# Patient Record
Sex: Female | Born: 1990 | Race: White | Hispanic: No | Marital: Single | Smoking: Never smoker
Health system: Southern US, Community
[De-identification: ages and names within clinical notes are randomized; demographics above are authoritative.]

## PROBLEM LIST (undated history)

## (undated) ENCOUNTER — Emergency Department (HOSPITAL_COMMUNITY): Discharge status: Absent without leave | Payer: Self-pay

## (undated) DIAGNOSIS — G43909 Migraine, unspecified, not intractable, without status migrainosus: Secondary | ICD-10-CM

## (undated) HISTORY — DX: Migraine, unspecified, not intractable, without status migrainosus: G43.909

## (undated) HISTORY — PX: NO PAST SURGERIES: SHX2092

---
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
## 2012-02-26 ENCOUNTER — Telehealth: Payer: Self-pay

## 2012-02-26 NOTE — Telephone Encounter (Signed)
Spoke to pt mom advising her what med pt was on for UTI 09/20/11. Pt mom states med pt is on now is making her sick and wanted to advise new dr. Edmonia Lynch she has taken in pass for UTI and was better.     MW

## 2012-07-24 ENCOUNTER — Encounter: Payer: Self-pay | Admitting: Family Medicine

## 2012-07-24 ENCOUNTER — Ambulatory Visit (INDEPENDENT_AMBULATORY_CARE_PROVIDER_SITE_OTHER): Payer: BC Managed Care – PPO | Admitting: Family Medicine

## 2012-07-24 VITALS — BP 118/74 | HR 54 | Temp 98.3°F | Ht 63.5 in | Wt 145.6 lb

## 2012-07-24 DIAGNOSIS — Z3041 Encounter for surveillance of contraceptive pills: Secondary | ICD-10-CM

## 2012-07-24 DIAGNOSIS — R21 Rash and other nonspecific skin eruption: Secondary | ICD-10-CM

## 2012-07-24 DIAGNOSIS — Z0000 Encounter for general adult medical examination without abnormal findings: Secondary | ICD-10-CM

## 2012-07-24 MED ORDER — NORETHIN ACE-ETH ESTRAD-FE 1-20 MG-MCG PO TABS: 1.0000 | ORAL_TABLET | Freq: Every day | ORAL | Status: DC

## 2012-07-24 MED ORDER — MOMETASONE FUROATE 0.1 % EX CREA: TOPICAL_CREAM | Freq: Every day | CUTANEOUS | Status: DC

## 2012-07-24 NOTE — Patient Instructions (Signed)
Preventive Care for Adults, Female A healthy lifestyle and preventive care can promote health and wellness. Preventive health guidelines for women include the following key practices.  A routine yearly physical is a good way to check with your caregiver about your health and preventive screening. It is a chance to share any concerns and updates on your health, and to receive a thorough exam.  Visit your dentist for a routine exam and preventive care every 6 months. Brush your teeth twice a day and floss once a day. Good oral hygiene prevents tooth decay and gum disease.  The frequency of eye exams is based on your age, health, family medical history, use of contact lenses, and other factors. Follow your caregiver's recommendations for frequency of eye exams.  Eat a healthy diet. Foods like vegetables, fruits, whole grains, low-fat dairy products, and lean protein foods contain the nutrients you need without too many calories. Decrease your intake of foods high in solid fats, added sugars, and salt. Eat the right amount of calories for you.Get information about a proper diet from your caregiver, if necessary.  Regular physical exercise is one of the most important things you can do for your health. Most adults should get at least 150 minutes of moderate-intensity exercise (any activity that increases your heart rate and causes you to sweat) each week. In addition, most adults need muscle-strengthening exercises on 2 or more days a week.  Maintain a healthy weight. The body mass index (BMI) is a screening tool to identify possible weight problems. It provides an estimate of body fat based on height and weight. Your caregiver can help determine your BMI, and can help you achieve or maintain a healthy weight.For adults 20 years and older:  A BMI below 18.5 is considered underweight.  A BMI of 18.5 to 24.9 is normal.  A BMI of 25 to 29.9 is considered overweight.  A BMI of 30 and above is  considered obese.  Maintain normal blood lipids and cholesterol levels by exercising and minimizing your intake of saturated fat. Eat a balanced diet with plenty of fruit and vegetables. Blood tests for lipids and cholesterol should begin at age 20 and be repeated every 5 years. If your lipid or cholesterol levels are high, you are over 50, or you are at high risk for heart disease, you may need your cholesterol levels checked more frequently.Ongoing high lipid and cholesterol levels should be treated with medicines if diet and exercise are not effective.  If you smoke, find out from your caregiver how to quit. If you do not use tobacco, do not start.  If you are pregnant, do not drink alcohol. If you are breastfeeding, be very cautious about drinking alcohol. If you are not pregnant and choose to drink alcohol, do not exceed 1 drink per day. One drink is considered to be 12 ounces (355 mL) of beer, 5 ounces (148 mL) of wine, or 1.5 ounces (44 mL) of liquor.  Avoid use of street drugs. Do not share needles with anyone. Ask for help if you need support or instructions about stopping the use of drugs.  High blood pressure causes heart disease and increases the risk of stroke. Your blood pressure should be checked at least every 1 to 2 years. Ongoing high blood pressure should be treated with medicines if weight loss and exercise are not effective.  If you are 55 to 20 years old, ask your caregiver if you should take aspirin to prevent strokes.  Diabetes   screening involves taking a blood sample to check your fasting blood sugar level. This should be done once every 3 years, after age 45, if you are within normal weight and without risk factors for diabetes. Testing should be considered at a younger age or be carried out more frequently if you are overweight and have at least 1 risk factor for diabetes.  Breast cancer screening is essential preventive care for women. You should practice "breast  self-awareness." This means understanding the normal appearance and feel of your breasts and may include breast self-examination. Any changes detected, no matter how small, should be reported to a caregiver. Women in their 20s and 30s should have a clinical breast exam (CBE) by a caregiver as part of a regular health exam every 1 to 3 years. After age 40, women should have a CBE every year. Starting at age 40, women should consider having a mammography (breast X-ray test) every year. Women who have a family history of breast cancer should talk to their caregiver about genetic screening. Women at a high risk of breast cancer should talk to their caregivers about having magnetic resonance imaging (MRI) and a mammography every year.  The Pap test is a screening test for cervical cancer. A Pap test can show cell changes on the cervix that might become cervical cancer if left untreated. A Pap test is a procedure in which cells are obtained and examined from the lower end of the uterus (cervix).  Women should have a Pap test starting at age 21.  Between ages 21 and 29, Pap tests should be repeated every 2 years.  Beginning at age 30, you should have a Pap test every 3 years as long as the past 3 Pap tests have been normal.  Some women have medical problems that increase the chance of getting cervical cancer. Talk to your caregiver about these problems. It is especially important to talk to your caregiver if a new problem develops soon after your last Pap test. In these cases, your caregiver may recommend more frequent screening and Pap tests.  The above recommendations are the same for women who have or have not gotten the vaccine for human papillomavirus (HPV).  If you had a hysterectomy for a problem that was not cancer or a condition that could lead to cancer, then you no longer need Pap tests. Even if you no longer need a Pap test, a regular exam is a good idea to make sure no other problems are  starting.  If you are between ages 65 and 70, and you have had normal Pap tests going back 10 years, you no longer need Pap tests. Even if you no longer need a Pap test, a regular exam is a good idea to make sure no other problems are starting.  If you have had past treatment for cervical cancer or a condition that could lead to cancer, you need Pap tests and screening for cancer for at least 20 years after your treatment.  If Pap tests have been discontinued, risk factors (such as a new sexual partner) need to be reassessed to determine if screening should be resumed.  The HPV test is an additional test that may be used for cervical cancer screening. The HPV test looks for the virus that can cause the cell changes on the cervix. The cells collected during the Pap test can be tested for HPV. The HPV test could be used to screen women aged 30 years and older, and should   be used in women of any age who have unclear Pap test results. After the age of 30, women should have HPV testing at the same frequency as a Pap test.  Colorectal cancer can be detected and often prevented. Most routine colorectal cancer screening begins at the age of 50 and continues through age 75. However, your caregiver may recommend screening at an earlier age if you have risk factors for colon cancer. On a yearly basis, your caregiver may provide home test kits to check for hidden blood in the stool. Use of a small camera at the end of a tube, to directly examine the colon (sigmoidoscopy or colonoscopy), can detect the earliest forms of colorectal cancer. Talk to your caregiver about this at age 50, when routine screening begins. Direct examination of the colon should be repeated every 5 to 10 years through age 75, unless early forms of pre-cancerous polyps or small growths are found.  Hepatitis C blood testing is recommended for all people born from 1945 through 1965 and any individual with known risks for hepatitis C.  Practice  safe sex. Use condoms and avoid high-risk sexual practices to reduce the spread of sexually transmitted infections (STIs). STIs include gonorrhea, chlamydia, syphilis, trichomonas, herpes, HPV, and human immunodeficiency virus (HIV). Herpes, HIV, and HPV are viral illnesses that have no cure. They can result in disability, cancer, and death. Sexually active women aged 25 and younger should be checked for chlamydia. Older women with new or multiple partners should also be tested for chlamydia. Testing for other STIs is recommended if you are sexually active and at increased risk.  Osteoporosis is a disease in which the bones lose minerals and strength with aging. This can result in serious bone fractures. The risk of osteoporosis can be identified using a bone density scan. Women ages 65 and over and women at risk for fractures or osteoporosis should discuss screening with their caregivers. Ask your caregiver whether you should take a calcium supplement or vitamin D to reduce the rate of osteoporosis.  Menopause can be associated with physical symptoms and risks. Hormone replacement therapy is available to decrease symptoms and risks. You should talk to your caregiver about whether hormone replacement therapy is right for you.  Use sunscreen with sun protection factor (SPF) of 30 or more. Apply sunscreen liberally and repeatedly throughout the day. You should seek shade when your shadow is shorter than you. Protect yourself by wearing long sleeves, pants, a wide-brimmed hat, and sunglasses year round, whenever you are outdoors.  Once a month, do a whole body skin exam, using a mirror to look at the skin on your back. Notify your caregiver of new moles, moles that have irregular borders, moles that are larger than a pencil eraser, or moles that have changed in shape or color.  Stay current with required immunizations.  Influenza. You need a dose every fall (or winter). The composition of the flu vaccine  changes each year, so being vaccinated once is not enough.  Pneumococcal polysaccharide. You need 1 to 2 doses if you smoke cigarettes or if you have certain chronic medical conditions. You need 1 dose at age 65 (or older) if you have never been vaccinated.  Tetanus, diphtheria, pertussis (Tdap, Td). Get 1 dose of Tdap vaccine if you are younger than age 65, are over 65 and have contact with an infant, are a healthcare worker, are pregnant, or simply want to be protected from whooping cough. After that, you need a Td   booster dose every 10 years. Consult your caregiver if you have not had at least 3 tetanus and diphtheria-containing shots sometime in your life or have a deep or dirty wound.  HPV. You need this vaccine if you are a woman age 26 or younger. The vaccine is given in 3 doses over 6 months.  Measles, mumps, rubella (MMR). You need at least 1 dose of MMR if you were born in 1957 or later. You may also need a second dose.  Meningococcal. If you are age 19 to 21 and a first-year college student living in a residence hall, or have one of several medical conditions, you need to get vaccinated against meningococcal disease. You may also need additional booster doses.  Zoster (shingles). If you are age 60 or older, you should get this vaccine.  Varicella (chickenpox). If you have never had chickenpox or you were vaccinated but received only 1 dose, talk to your caregiver to find out if you need this vaccine.  Hepatitis A. You need this vaccine if you have a specific risk factor for hepatitis A virus infection or you simply wish to be protected from this disease. The vaccine is usually given as 2 doses, 6 to 18 months apart.  Hepatitis B. You need this vaccine if you have a specific risk factor for hepatitis B virus infection or you simply wish to be protected from this disease. The vaccine is given in 3 doses, usually over 6 months. Preventive Services / Frequency Ages 19 to 39  Blood  pressure check.** / Every 1 to 2 years.  Lipid and cholesterol check.** / Every 5 years beginning at age 20.  Clinical breast exam.** / Every 3 years for women in their 20s and 30s.  Pap test.** / Every 2 years from ages 21 through 29. Every 3 years starting at age 30 through age 65 or 70 with a history of 3 consecutive normal Pap tests.  HPV screening.** / Every 3 years from ages 30 through ages 65 to 70 with a history of 3 consecutive normal Pap tests.  Hepatitis C blood test.** / For any individual with known risks for hepatitis C.  Skin self-exam. / Monthly.  Influenza immunization.** / Every year.  Pneumococcal polysaccharide immunization.** / 1 to 2 doses if you smoke cigarettes or if you have certain chronic medical conditions.  Tetanus, diphtheria, pertussis (Tdap, Td) immunization. / A one-time dose of Tdap vaccine. After that, you need a Td booster dose every 10 years.  HPV immunization. / 3 doses over 6 months, if you are 26 and younger.  Measles, mumps, rubella (MMR) immunization. / You need at least 1 dose of MMR if you were born in 1957 or later. You may also need a second dose.  Meningococcal immunization. / 1 dose if you are age 19 to 21 and a first-year college student living in a residence hall, or have one of several medical conditions, you need to get vaccinated against meningococcal disease. You may also need additional booster doses.  Varicella immunization.** / Consult your caregiver.  Hepatitis A immunization.** / Consult your caregiver. 2 doses, 6 to 18 months apart.  Hepatitis B immunization.** / Consult your caregiver. 3 doses usually over 6 months. Ages 40 to 64  Blood pressure check.** / Every 1 to 2 years.  Lipid and cholesterol check.** / Every 5 years beginning at age 20.  Clinical breast exam.** / Every year after age 40.  Mammogram.** / Every year beginning at age 40   and continuing for as long as you are in good health. Consult with your  caregiver.  Pap test.** / Every 3 years starting at age 30 through age 65 or 70 with a history of 3 consecutive normal Pap tests.  HPV screening.** / Every 3 years from ages 30 through ages 65 to 70 with a history of 3 consecutive normal Pap tests.  Fecal occult blood test (FOBT) of stool. / Every year beginning at age 50 and continuing until age 75. You may not need to do this test if you get a colonoscopy every 10 years.  Flexible sigmoidoscopy or colonoscopy.** / Every 5 years for a flexible sigmoidoscopy or every 10 years for a colonoscopy beginning at age 50 and continuing until age 75.  Hepatitis C blood test.** / For all people born from 1945 through 1965 and any individual with known risks for hepatitis C.  Skin self-exam. / Monthly.  Influenza immunization.** / Every year.  Pneumococcal polysaccharide immunization.** / 1 to 2 doses if you smoke cigarettes or if you have certain chronic medical conditions.  Tetanus, diphtheria, pertussis (Tdap, Td) immunization.** / A one-time dose of Tdap vaccine. After that, you need a Td booster dose every 10 years.  Measles, mumps, rubella (MMR) immunization. / You need at least 1 dose of MMR if you were born in 1957 or later. You may also need a second dose.  Varicella immunization.** / Consult your caregiver.  Meningococcal immunization.** / Consult your caregiver.  Hepatitis A immunization.** / Consult your caregiver. 2 doses, 6 to 18 months apart.  Hepatitis B immunization.** / Consult your caregiver. 3 doses, usually over 6 months. Ages 65 and over  Blood pressure check.** / Every 1 to 2 years.  Lipid and cholesterol check.** / Every 5 years beginning at age 20.  Clinical breast exam.** / Every year after age 40.  Mammogram.** / Every year beginning at age 40 and continuing for as long as you are in good health. Consult with your caregiver.  Pap test.** / Every 3 years starting at age 30 through age 65 or 70 with a 3  consecutive normal Pap tests. Testing can be stopped between 65 and 70 with 3 consecutive normal Pap tests and no abnormal Pap or HPV tests in the past 10 years.  HPV screening.** / Every 3 years from ages 30 through ages 65 or 70 with a history of 3 consecutive normal Pap tests. Testing can be stopped between 65 and 70 with 3 consecutive normal Pap tests and no abnormal Pap or HPV tests in the past 10 years.  Fecal occult blood test (FOBT) of stool. / Every year beginning at age 50 and continuing until age 75. You may not need to do this test if you get a colonoscopy every 10 years.  Flexible sigmoidoscopy or colonoscopy.** / Every 5 years for a flexible sigmoidoscopy or every 10 years for a colonoscopy beginning at age 50 and continuing until age 75.  Hepatitis C blood test.** / For all people born from 1945 through 1965 and any individual with known risks for hepatitis C.  Osteoporosis screening.** / A one-time screening for women ages 65 and over and women at risk for fractures or osteoporosis.  Skin self-exam. / Monthly.  Influenza immunization.** / Every year.  Pneumococcal polysaccharide immunization.** / 1 dose at age 65 (or older) if you have never been vaccinated.  Tetanus, diphtheria, pertussis (Tdap, Td) immunization. / A one-time dose of Tdap vaccine if you are over   65 and have contact with an infant, are a healthcare worker, or simply want to be protected from whooping cough. After that, you need a Td booster dose every 10 years.  Varicella immunization.** / Consult your caregiver.  Meningococcal immunization.** / Consult your caregiver.  Hepatitis A immunization.** / Consult your caregiver. 2 doses, 6 to 18 months apart.  Hepatitis B immunization.** / Check with your caregiver. 3 doses, usually over 6 months. ** Family history and personal history of risk and conditions may change your caregiver's recommendations. Document Released: 06/26/2001 Document Revised: 07/23/2011  Document Reviewed: 09/25/2010 [employer] Patient Information 2013 [employer].  

## 2012-07-24 NOTE — Progress Notes (Signed)
Subjective:     Jordan Dominguez is a 22 y.o. female and is here for a comprehensive physical exam. The patient reports problems - skin rash , insomnia,  headaches.  History   Social History  . Marital Status: Single    Spouse Name: N/A    Number of Children: N/A  . Years of Education: N/A   Occupational History  . Not on file.   Social History Main Topics  . Smoking status: Never Smoker   . Smokeless tobacco: Never Used  . Alcohol Use: Yes     Comment: rare  . Drug Use: No  . Sexually Active: Yes -- Female partner(s)    Birth Control/ Protection: Pill   Other Topics Concern  . Not on file   Social History Narrative  . No narrative on file   Health Maintenance  Topic Date Due  . Chlamydia Screening  05/03/2008  . Influenza Vaccine  01/12/2010  . Pap Smear  05/04/2011  . Tetanus/tdap  05/03/2012    The following portions of the patient's history were reviewed and updated as appropriate:  She  has no past medical history on file. She  does not have any pertinent problems on file. She  has no past surgical history on file. Her family history includes ADD / ADHD in her mother; Diabetes in her maternal grandmother; Hyperlipidemia in her maternal grandmother; and Hypertension in her maternal grandmother. She  reports that she has never smoked. She has never used smokeless tobacco. She reports that  drinks alcohol. She reports that she does not use illicit drugs. She has a current medication list which includes the following prescription(s): norethindrone-ethinyl estradiol, mometasone, and norethindrone-ethinyl estradiol. No current outpatient prescriptions on file prior to visit.   No current facility-administered medications on file prior to visit.   She has No Known Allergies..  Review of Systems Review of Systems  Constitutional: Negative for activity change, appetite change and fatigue.  HENT: Negative for hearing loss, congestion, tinnitus and ear discharge.   dentist q60m Eyes: Negative for visual disturbance  Respiratory: Negative for cough, chest tightness and shortness of breath.   Cardiovascular: Negative for chest pain, palpitations and leg swelling.  Gastrointestinal: Negative for abdominal pain, diarrhea, constipation and abdominal distention.  Genitourinary: Negative for urgency, frequency, decreased urine volume and difficulty urinating.  Musculoskeletal: Negative for back pain, arthralgias and gait problem.  Skin: Negative for color change, pallor and rash.  Neurological: Negative for dizziness, light-headedness, numbness and headaches.  Hematological: Negative for adenopathy. Does not bruise/bleed easily.  Psychiatric/Behavioral: Negative for suicidal ideas, confusion, sleep disturbance, self-injury, dysphoric mood, decreased concentration and agitation.       Objective:    BP 118/74  Pulse 54  Temp(Src) 98.3 F (36.8 C) (Oral)  Ht 5' 3.5" (1.613 m)  Wt 145 lb 9.6 oz (66.044 kg)  BMI 25.38 kg/m2  SpO2 98% General appearance: alert, cooperative, appears stated age and no distress Head: Normocephalic, without obvious abnormality, atraumatic Eyes: conjunctivae/corneas clear. PERRL, EOM's intact. Fundi benign. Ears: normal TM's and external ear canals both ears Nose: Nares normal. Septum midline. Mucosa normal. No drainage or sinus tenderness. Throat: lips, mucosa, and tongue normal; teeth and gums normal Neck: no adenopathy, supple, symmetrical, trachea midline and thyroid not enlarged, symmetric, no tenderness/mass/nodules Back: symmetric, no curvature. ROM normal. No CVA tenderness. Lungs: clear to auscultation bilaterally Breasts: normal appearance, no masses or tenderness Heart: regular rate and rhythm, S1, S2 normal, no murmur, click, rub or gallop Abdomen: soft,  non-tender; bowel sounds normal; no masses,  no organomegaly Extremities: extremities normal, atraumatic, no cyanosis or edema Pulses: 2+ and symmetric Skin:  Skin color, texture, turgor normal. No rashes or lesions Lymph nodes: Cervical, supraclavicular, and axillary nodes normal. Neurologic: Alert and oriented X 3, normal strength and tone. Normal symmetric reflexes. Normal coordination and gait    Assessment:    Healthy female exam.      Plan:    ghm utd Check cbc and urine See After Visit Summary for Counseling Recommendations

## 2012-07-25 LAB — CBC WITH DIFFERENTIAL/PLATELET
Basophils Relative: 1.6 % (ref 0.0–3.0)
Eosinophils Absolute: 0.1 10*3/uL (ref 0.0–0.7)
Eosinophils Relative: 1.3 % (ref 0.0–5.0)
HCT: 37 % (ref 36.0–46.0)
Lymphs Abs: 3.1 10*3/uL (ref 0.7–4.0)
MCHC: 33.3 g/dL (ref 30.0–36.0)
MCV: 84.4 fl (ref 78.0–100.0)
Monocytes Absolute: 0.3 10*3/uL (ref 0.1–1.0)
Platelets: 329 10*3/uL (ref 150.0–400.0)
WBC: 8.5 10*3/uL (ref 4.5–10.5)

## 2012-07-25 LAB — GC/CHLAMYDIA PROBE AMP, URINE: Chlamydia, Swab/Urine, PCR: NEGATIVE

## 2012-12-04 ENCOUNTER — Encounter: Payer: Self-pay | Admitting: Internal Medicine

## 2012-12-04 ENCOUNTER — Ambulatory Visit (INDEPENDENT_AMBULATORY_CARE_PROVIDER_SITE_OTHER): Payer: BC Managed Care – PPO | Admitting: Internal Medicine

## 2012-12-04 VITALS — BP 104/68 | HR 72 | Temp 98.3°F | Wt 139.0 lb

## 2012-12-04 DIAGNOSIS — J029 Acute pharyngitis, unspecified: Secondary | ICD-10-CM

## 2012-12-04 MED ORDER — CEPHALEXIN 500 MG PO CAPS: 500.0000 mg | ORAL_CAPSULE | Freq: Two times a day (BID) | ORAL | Status: DC

## 2012-12-04 NOTE — Progress Notes (Signed)
  Subjective:    Patient ID: Jordan Dominguez, female    DOB: Jun 16, 1990, 22 y.o.   MRN: [id number]  HPI   Symptoms began 12/03/12 as a sore throat associated with swelling in the back of her throat and bilateral submandibular pain. She's also had some headache over the crown.  Claritin and Benadryl were of minimal benefit    Review of Systems  She denies any significant extrinsic symptoms of itchy, watery eyes, or sneezing. She's had no fever, chills, or sweats.  She also denies frontal sinus pain, facial pain, nasal purulence, dental pain, otic pain, or otic discharge  She has no cough, sputum, shortness of breath, or wheezing.     Objective:   Physical Exam  General appearance:good health ;well nourished; no acute distress or increased work of breathing is present but appearsfatigued.  No  lymphadenopathy about the head, neck, or axilla noted.   Eyes: No conjunctival inflammation or lid edema is present.   Ears:  External ear exam shows no significant lesions or deformities.  Otoscopic examination reveals clear canals, tympanic membranes are intact bilaterally without bulging, retraction, inflammation or discharge.  Nose:  External nasal examination shows no deformity or inflammation. Nasal mucosa are pink and moist without lesions or exudates. No septal dislocation or deviation.No obstruction to airflow.   Oral exam: Dental hygiene is good; lips and gums are healthy appearing.There is marked R oropharyngeal erythema with slight exudate  & 1.5 + tonsilar hypertrophy   Neck:  No deformities,  masses, or tenderness noted.   Supple with full range of motion without pain.   Heart:  Normal rate and regular rhythm. S1 and S2 normal without gallop, murmur, click, rub or other extra sounds.   Lungs:Chest clear to auscultation; no wheezes, rhonchi,rales ,or rubs present.No increased work of breathing.    Extremities:  No cyanosis, edema, or clubbing  noted    Skin: Warm & dry           Assessment & Plan:  #1 pharyngitis with tonsillar hypertrophy and slight exudate; rule out strep/rule out mono  Plan see orders  & recommendations

## 2012-12-04 NOTE — Patient Instructions (Addendum)
Zicam Melts or Zinc lozenges as per package label for scratchy throat . Complementary options include  vitamin C 2000 mg daily; & Echinacea for 4-7 days. Report persistent or progressive fever; discolored nasal or chest secretions; or frontal headache or facial  pain.      

## 2013-08-06 ENCOUNTER — Telehealth: Payer: Self-pay | Admitting: Family Medicine

## 2013-08-06 DIAGNOSIS — IMO0001 Reserved for inherently not codable concepts without codable children: Secondary | ICD-10-CM

## 2013-08-06 MED ORDER — NORETHINDRONE ACET-ETHINYL EST 1-20 MG-MCG PO TABS: 1.0000 | ORAL_TABLET | Freq: Every day | ORAL | Status: DC

## 2013-08-06 NOTE — Telephone Encounter (Signed)
Refill for 1 month of BCP sent to

## 2013-08-06 NOTE — Telephone Encounter (Signed)
Patient is requesting a [facility] of her [facility]

## 2013-08-12 ENCOUNTER — Other Ambulatory Visit: Payer: Self-pay | Admitting: Family Medicine

## 2013-08-12 MED ORDER — NORETHINDRONE ACET-ETHINYL EST 1-20 MG-MCG PO TABS: 1.0000 | ORAL_TABLET | Freq: Every day | ORAL | Status: DC

## 2013-08-12 NOTE — Telephone Encounter (Signed)
Rx re-sent   KP 

## 2013-08-12 NOTE — Telephone Encounter (Signed)
Patient called and stated that her pharmacy still does not have her BCP. Please resend thanks.

## 2013-08-12 NOTE — Addendum Note (Signed)
Addended by: Arnette NorrisPAYNE, Adelia Baptista P on: 08/12/2013 11:03 AM   Modules accepted: Orders

## 2014-11-19 ENCOUNTER — Telehealth: Payer: Self-pay | Admitting: *Deleted

## 2014-11-19 NOTE — Telephone Encounter (Signed)
Unable to reach patient at time of Pre-Visit Call (she is in class)  Grandfather answered phone and stated she is aware of appointment Monday at 1:30.

## 2014-11-22 ENCOUNTER — Encounter: Payer: Self-pay | Admitting: Family Medicine

## 2014-11-22 ENCOUNTER — Other Ambulatory Visit (HOSPITAL_COMMUNITY)
Admission: RE | Admit: 2014-11-22 | Discharge: 2014-11-22 | Disposition: A | Discharge status: Home / Self Care | Payer: Commercial Managed Care - PPO | Source: Ambulatory Visit | Attending: Family Medicine | Admitting: Family Medicine

## 2014-11-22 ENCOUNTER — Ambulatory Visit (INDEPENDENT_AMBULATORY_CARE_PROVIDER_SITE_OTHER): Payer: Commercial Managed Care - PPO | Admitting: Family Medicine

## 2014-11-22 VITALS — BP 118/73 | HR 60 | Temp 98.3°F | Ht 63.75 in | Wt 171.8 lb

## 2014-11-22 DIAGNOSIS — Z3009 Encounter for other general counseling and advice on contraception: Secondary | ICD-10-CM

## 2014-11-22 DIAGNOSIS — G43109 Migraine with aura, not intractable, without status migrainosus: Secondary | ICD-10-CM | POA: Diagnosis not present

## 2014-11-22 DIAGNOSIS — Z124 Encounter for screening for malignant neoplasm of cervix: Secondary | ICD-10-CM

## 2014-11-22 DIAGNOSIS — Z113 Encounter for screening for infections with a predominantly sexual mode of transmission: Secondary | ICD-10-CM | POA: Insufficient documentation

## 2014-11-22 DIAGNOSIS — Z01419 Encounter for gynecological examination (general) (routine) without abnormal findings: Secondary | ICD-10-CM | POA: Diagnosis not present

## 2014-11-22 DIAGNOSIS — Z0000 Encounter for general adult medical examination without abnormal findings: Secondary | ICD-10-CM | POA: Diagnosis not present

## 2014-11-22 DIAGNOSIS — N760 Acute vaginitis: Secondary | ICD-10-CM | POA: Diagnosis present

## 2014-11-22 DIAGNOSIS — G43809 Other migraine, not intractable, without status migrainosus: Secondary | ICD-10-CM

## 2014-11-22 DIAGNOSIS — Z418 Encounter for other procedures for purposes other than remedying health state: Secondary | ICD-10-CM

## 2014-11-22 DIAGNOSIS — R8290 Unspecified abnormal findings in urine: Secondary | ICD-10-CM

## 2014-11-22 DIAGNOSIS — Z299 Encounter for prophylactic measures, unspecified: Secondary | ICD-10-CM

## 2014-11-22 MED ORDER — ELETRIPTAN HYDROBROMIDE 20 MG PO TABS: 20.0000 mg | ORAL_TABLET | ORAL | Status: DC | PRN

## 2014-11-22 MED ORDER — NORETHIN ACE-ETH ESTRAD-FE 1-20 MG-MCG PO TABS: 1.0000 | ORAL_TABLET | Freq: Every day | ORAL | Status: DC

## 2014-11-22 NOTE — Progress Notes (Signed)
Pre visit review using our clinic review tool, if applicable. No additional management support is needed unless otherwise documented below in the visit note. 

## 2014-11-22 NOTE — Progress Notes (Signed)
Subjective:     Jordan Dominguez is a 24 y.o. female and is here for a comprehensive physical exam. The patient reports problems - recurrent headaches-- increased with stress-- ? aura.  Headache come every 2 days-- migraines are 1x a month.    History   Social History  . Marital Status: Single    Spouse Name: N/A  . Number of Children: N/A  . Years of Education: N/A   Occupational History  . Not on file.   Social History Main Topics  . Smoking status: Never Smoker   . Smokeless tobacco: Never Used  . Alcohol Use: Yes     Comment: rare  . Drug Use: No  . Sexual Activity:    Partners: Male    Birth Control/ Protection: Pill   Other Topics Concern  . Not on file   Social History Narrative   Health Maintenance  Topic Date Due  . PAP SMEAR  05/04/2011  . TETANUS/TDAP  05/03/2012  . CHLAMYDIA SCREENING  11/22/2015 (Originally 07/24/2013)  . HIV Screening  11/22/2015 (Originally 05/03/2008)  . INFLUENZA VACCINE  12/13/2014    The following portions of the patient's history were reviewed and updated as appropriate:  She  has no past medical history on file. She  does not have any pertinent problems on file. She  has no past surgical history on file. Her family history includes ADD / ADHD in her mother; Diabetes in her maternal grandmother; Hyperlipidemia in her maternal grandmother; Hypertension in her maternal grandmother. She  reports that she has never smoked. She has never used smokeless tobacco. She reports that she drinks alcohol. She reports that she does not use illicit drugs. She has a current medication list which includes the following prescription(s): norethindrone-ethinyl estradiol. No current outpatient prescriptions on file prior to visit.   No current facility-administered medications on file prior to visit.   She has No Known Allergies..  Review of Systems Review of Systems  Constitutional: Negative for activity change, appetite change and fatigue.   HENT: Negative for hearing loss, congestion, tinnitus and ear discharge.  dentist q67m Eyes: Negative for visual disturbance (see optho q1y -- vision corrected to 20/20 with glasses).  Respiratory: Negative for cough, chest tightness and shortness of breath.   Cardiovascular: Negative for chest pain, palpitations and leg swelling.  Gastrointestinal: Negative for abdominal pain, diarrhea, constipation and abdominal distention.  Genitourinary: Negative for urgency, frequency, decreased urine volume and difficulty urinating.  Musculoskeletal: Negative for back pain, arthralgias and gait problem.  Skin: Negative for color change, pallor and rash.  Neurological: Negative for dizziness, light-headedness, numbness and headaches.  Hematological: Negative for adenopathy. Does not bruise/bleed easily.  Psychiatric/Behavioral: Negative for suicidal ideas, confusion, sleep disturbance, self-injury, dysphoric mood, decreased concentration and agitation.       Objective:    BP 118/73 mmHg  Pulse 60  Temp(Src) 98.3 F (36.8 C) (Oral)  Ht 5' 3.75" (1.619 m)  Wt 171 lb 12.8 oz (77.928 kg)  BMI 29.73 kg/m2  SpO2 99%  LMP 11/01/2014 General appearance: alert, cooperative, appears stated age and no distress Head: Normocephalic, without obvious abnormality, atraumatic Eyes: conjunctivae/corneas clear. PERRL, EOM's intact. Fundi benign. Ears: normal TM's and external ear canals both ears Nose: Nares normal. Septum midline. Mucosa normal. No drainage or sinus tenderness. Throat: lips, mucosa, and tongue normal; teeth and gums normal Neck: no adenopathy, no carotid bruit, no JVD, supple, symmetrical, trachea midline and thyroid not enlarged, symmetric, no tenderness/mass/nodules Back: symmetric, no curvature. ROM  normal. No CVA tenderness. Lungs: clear to auscultation bilaterally Breasts: normal appearance, no masses or tenderness Heart: regular rate and rhythm, S1, S2 normal, no murmur, click, rub or  gallop Abdomen: soft, non-tender; bowel sounds normal; no masses,  no organomegaly Pelvic: cervix normal in appearance, external genitalia normal, no adnexal masses or tenderness, no cervical motion tenderness, rectovaginal septum normal, uterus normal size, shape, and consistency, vagina normal without discharge and pap done Extremities: extremities normal, atraumatic, no cyanosis or edema, no edema, redness or tenderness in the calves or thighs, no ulcers, gangrene or trophic changes and venous stasis dermatitis noted Pulses: 2+ and symmetric Skin: Skin color, texture, turgor normal. No rashes or lesions Lymph nodes: Cervical, supraclavicular, and axillary nodes normal. Neurologic: Alert and oriented X 3, normal strength and tone. Normal symmetric reflexes. Normal coordination and gait Psych- no depression, no anxiety      Assessment:    Healthy female exam.       Plan:     ghm utd Check labs See After Visit Summary for Counseling Recommendations    1. Other migraine without status migrainosus, not intractable   - Ambulatory referral to Neurology  2. Encounter for other general counseling or advice on contraception Secondary to pt migraines she may need alternative birth control---- referring to gyn so she can be informed about all her options - Ambulatory referral to Gynecology  3. Preventative health care See AVS - Basic metabolic panel - CBC with Differential/Platelet - Hepatic function panel - Lipid panel - POCT urinalysis dipstick - TSH  4. Migraine with aura and without status migrainosus, not intractable  - eletriptan (RELPAX) 20 MG tablet; Take 1 tablet (20 mg total) by mouth as needed for migraine or headache. May repeat in 2 hours if headache persists or recurs.  Dispense: 10 tablet; Refill: 0  5. Cervical cancer screening  - Cytology - PAP  6. Need for prophylactic measure  - Meningococcal B, OMV (Bexsero)

## 2014-11-22 NOTE — Patient Instructions (Signed)
Contraception Choices Contraception (birth control) is the use of any methods or devices to prevent pregnancy. Below are some methods to help avoid pregnancy. HORMONAL METHODS   Contraceptive implant. This is a thin, plastic tube containing progesterone hormone. It does not contain estrogen hormone. Your health care provider inserts the tube in the inner part of the upper arm. The tube can remain in place for up to 3 years. After 3 years, the implant must be removed. The implant prevents the ovaries from releasing an egg (ovulation), thickens the cervical mucus to prevent sperm from entering the uterus, and thins the lining of the inside of the uterus.  Progesterone-only injections. These injections are given every 3 months by your health care provider to prevent pregnancy. This synthetic progesterone hormone stops the ovaries from releasing eggs. It also thickens cervical mucus and changes the uterine lining. This makes it harder for sperm to survive in the uterus.  Birth control pills. These pills contain estrogen and progesterone hormone. They work by preventing the ovaries from releasing eggs (ovulation). They also cause the cervical mucus to thicken, preventing the sperm from entering the uterus. Birth control pills are prescribed by a health care provider.Birth control pills can also be used to treat heavy periods.  Minipill. This type of birth control pill contains only the progesterone hormone. They are taken every day of each month and must be prescribed by your health care provider.  Birth control patch. The patch contains hormones similar to those in birth control pills. It must be changed once a week and is prescribed by a health care provider.  Vaginal ring. The ring contains hormones similar to those in birth control pills. It is left in the vagina for 3 weeks, removed for 1 week, and then a new one is put back in place. The patient must be comfortable inserting and removing the ring  from the vagina.A health care provider's prescription is necessary.  Emergency contraception. Emergency contraceptives prevent pregnancy after unprotected sexual intercourse. This pill can be taken right after sex or up to 5 days after unprotected sex. It is most effective the sooner you take the pills after having sexual intercourse. Most emergency contraceptive pills are available without a prescription. Check with your pharmacist. Do not use emergency contraception as your only form of birth control. BARRIER METHODS   Female condom. This is a thin sheath (latex or rubber) that is worn over the penis during sexual intercourse. It can be used with spermicide to increase effectiveness.  Female condom. This is a soft, loose-fitting sheath that is put into the vagina before sexual intercourse.  Diaphragm. This is a soft, latex, dome-shaped barrier that must be fitted by a health care provider. It is inserted into the vagina, along with a spermicidal jelly. It is inserted before intercourse. The diaphragm should be left in the vagina for 6 to 8 hours after intercourse.  Cervical cap. This is a round, soft, latex or plastic cup that fits over the cervix and must be fitted by a health care provider. The cap can be left in place for up to 48 hours after intercourse.  Sponge. This is a soft, circular piece of polyurethane foam. The sponge has spermicide in it. It is inserted into the vagina after wetting it and before sexual intercourse.  Spermicides. These are chemicals that kill or block sperm from entering the cervix and uterus. They come in the form of creams, jellies, suppositories, foam, or tablets. They do not require a   prescription. They are inserted into the vagina with an applicator before having sexual intercourse. The process must be repeated every time you have sexual intercourse. INTRAUTERINE CONTRACEPTION  Intrauterine device (IUD). This is a T-shaped device that is put in a woman's uterus  during a menstrual period to prevent pregnancy. There are 2 types:  Copper IUD. This type of IUD is wrapped in copper wire and is placed inside the uterus. Copper makes the uterus and fallopian tubes produce a fluid that kills sperm. It can stay in place for 10 years.  Hormone IUD. This type of IUD contains the hormone progestin (synthetic progesterone). The hormone thickens the cervical mucus and prevents sperm from entering the uterus, and it also thins the uterine lining to prevent implantation of a fertilized egg. The hormone can weaken or kill the sperm that get into the uterus. It can stay in place for 3-5 years, depending on which type of IUD is used. PERMANENT METHODS OF CONTRACEPTION  Female tubal ligation. This is when the woman's fallopian tubes are surgically sealed, tied, or blocked to prevent the egg from traveling to the uterus.  Hysteroscopic sterilization. This involves placing a small coil or insert into each fallopian tube. Your doctor uses a technique called hysteroscopy to do the procedure. The device causes scar tissue to form. This results in permanent blockage of the fallopian tubes, so the sperm cannot fertilize the egg. It takes about 3 months after the procedure for the tubes to become blocked. You must use another form of birth control for these 3 months.  Female sterilization. This is when the female has the tubes that carry sperm tied off (vasectomy).This blocks sperm from entering the vagina during sexual intercourse. After the procedure, the man can still ejaculate fluid (semen). NATURAL PLANNING METHODS  Natural family planning. This is not having sexual intercourse or using a barrier method (condom, diaphragm, cervical cap) on days the woman could become pregnant.  Calendar method. This is keeping track of the length of each menstrual cycle and identifying when you are fertile.  Ovulation method. This is avoiding sexual intercourse during ovulation.  Symptothermal  method. This is avoiding sexual intercourse during ovulation, using a thermometer and ovulation symptoms.  Post-ovulation method. This is timing sexual intercourse after you have ovulated. Regardless of which type or method of contraception you choose, it is important that you use condoms to protect against the transmission of sexually transmitted infections (STIs). Talk with your health care provider about which form of contraception is most appropriate for you. Document Released: 04/30/2005 Document Revised: 05/05/2013 Document Reviewed: 10/23/2012 [employer] Patient Information 2015 [employer]. This information is not intended to replace advice given to you by your health care provider. Make sure you discuss any questions you have with your health care provider. Preventive Care for Adults A healthy lifestyle and preventive care can promote health and wellness. Preventive health guidelines for women include the following key practices.  A routine yearly physical is a good way to check with your health care provider about your health and preventive screening. It is a chance to share any concerns and updates on your health and to receive a thorough exam.  Visit your dentist for a routine exam and preventive care every 6 months. Brush your teeth twice a day and floss once a day. Good oral hygiene prevents tooth decay and gum disease.  The frequency of eye exams is based on your age, health, family medical history, use of contact lenses, and other   factors. Follow your health care provider's recommendations for frequency of eye exams.  Eat a healthy diet. Foods like vegetables, fruits, whole grains, low-fat dairy products, and lean protein foods contain the nutrients you need without too many calories. Decrease your intake of foods high in solid fats, added sugars, and salt. Eat the right amount of calories for you.Get information about a proper diet from your health care provider, if  necessary.  Regular physical exercise is one of the most important things you can do for your health. Most adults should get at least 150 minutes of moderate-intensity exercise (any activity that increases your heart rate and causes you to sweat) each week. In addition, most adults need muscle-strengthening exercises on 2 or more days a week.  Maintain a healthy weight. The body mass index (BMI) is a screening tool to identify possible weight problems. It provides an estimate of body fat based on height and weight. Your health care provider can find your BMI and can help you achieve or maintain a healthy weight.For adults 20 years and older:  A BMI below 18.5 is considered underweight.  A BMI of 18.5 to 24.9 is normal.  A BMI of 25 to 29.9 is considered overweight.  A BMI of 30 and above is considered obese.  Maintain normal blood lipids and cholesterol levels by exercising and minimizing your intake of saturated fat. Eat a balanced diet with plenty of fruit and vegetables. Blood tests for lipids and cholesterol should begin at age 20 and be repeated every 5 years. If your lipid or cholesterol levels are high, you are over 50, or you are at high risk for heart disease, you may need your cholesterol levels checked more frequently.Ongoing high lipid and cholesterol levels should be treated with medicines if diet and exercise are not working.  If you smoke, find out from your health care provider how to quit. If you do not use tobacco, do not start.  Lung cancer screening is recommended for adults aged 55-80 years who are at high risk for developing lung cancer because of a history of smoking. A yearly low-dose CT scan of the lungs is recommended for people who have at least a 30-pack-year history of smoking and are a current smoker or have quit within the past 15 years. A pack year of smoking is smoking an average of 1 pack of cigarettes a day for 1 year (for example: 1 pack a day for 30 years or 2  packs a day for 15 years). Yearly screening should continue until the smoker has stopped smoking for at least 15 years. Yearly screening should be stopped for people who develop a health problem that would prevent them from having lung cancer treatment.  If you are pregnant, do not drink alcohol. If you are breastfeeding, be very cautious about drinking alcohol. If you are not pregnant and choose to drink alcohol, do not have more than 1 drink per day. One drink is considered to be 12 ounces (355 mL) of beer, 5 ounces (148 mL) of wine, or 1.5 ounces (44 mL) of liquor.  Avoid use of street drugs. Do not share needles with anyone. Ask for help if you need support or instructions about stopping the use of drugs.  High blood pressure causes heart disease and increases the risk of stroke. Your blood pressure should be checked at least every 1 to 2 years. Ongoing high blood pressure should be treated with medicines if weight loss and exercise do not work.    If you are 55-79 years old, ask your health care provider if you should take aspirin to prevent strokes.  Diabetes screening involves taking a blood sample to check your fasting blood sugar level. This should be done once every 3 years, after age 45, if you are within normal weight and without risk factors for diabetes. Testing should be considered at a younger age or be carried out more frequently if you are overweight and have at least 1 risk factor for diabetes.  Breast cancer screening is essential preventive care for women. You should practice "breast self-awareness." This means understanding the normal appearance and feel of your breasts and may include breast self-examination. Any changes detected, no matter how small, should be reported to a health care provider. Women in their 20s and 30s should have a clinical breast exam (CBE) by a health care provider as part of a regular health exam every 1 to 3 years. After age 40, women should have a CBE every  year. Starting at age 40, women should consider having a mammogram (breast X-ray test) every year. Women who have a family history of breast cancer should talk to their health care provider about genetic screening. Women at a high risk of breast cancer should talk to their health care providers about having an MRI and a mammogram every year.  Breast cancer gene (BRCA)-related cancer risk assessment is recommended for women who have family members with BRCA-related cancers. BRCA-related cancers include breast, ovarian, tubal, and peritoneal cancers. Having family members with these cancers may be associated with an increased risk for harmful changes (mutations) in the breast cancer genes BRCA1 and BRCA2. Results of the assessment will determine the need for genetic counseling and BRCA1 and BRCA2 testing.  Routine pelvic exams to screen for cancer are no longer recommended for nonpregnant women who are considered low risk for cancer of the pelvic organs (ovaries, uterus, and vagina) and who do not have symptoms. Ask your health care provider if a screening pelvic exam is right for you.  If you have had past treatment for cervical cancer or a condition that could lead to cancer, you need Pap tests and screening for cancer for at least 20 years after your treatment. If Pap tests have been discontinued, your risk factors (such as having a new sexual partner) need to be reassessed to determine if screening should be resumed. Some women have medical problems that increase the chance of getting cervical cancer. In these cases, your health care provider may recommend more frequent screening and Pap tests.  The HPV test is an additional test that may be used for cervical cancer screening. The HPV test looks for the virus that can cause the cell changes on the cervix. The cells collected during the Pap test can be tested for HPV. The HPV test could be used to screen women aged 30 years and older, and should be used in  women of any age who have unclear Pap test results. After the age of 30, women should have HPV testing at the same frequency as a Pap test.  Colorectal cancer can be detected and often prevented. Most routine colorectal cancer screening begins at the age of 50 years and continues through age 75 years. However, your health care provider may recommend screening at an earlier age if you have risk factors for colon cancer. On a yearly basis, your health care provider may provide home test kits to check for hidden blood in the stool. Use of a   small camera at the end of a tube, to directly examine the colon (sigmoidoscopy or colonoscopy), can detect the earliest forms of colorectal cancer. Talk to your health care provider about this at age 50, when routine screening begins. Direct exam of the colon should be repeated every 5-10 years through age 75 years, unless early forms of pre-cancerous polyps or small growths are found.  People who are at an increased risk for hepatitis B should be screened for this virus. You are considered at high risk for hepatitis B if:  You were born in a country where hepatitis B occurs often. Talk with your health care provider about which countries are considered high risk.  Your parents were born in a high-risk country and you have not received a shot to protect against hepatitis B (hepatitis B vaccine).  You have HIV or AIDS.  You use needles to inject street drugs.  You live with, or have sex with, someone who has hepatitis B.  You get hemodialysis treatment.  You take certain medicines for conditions like cancer, organ transplantation, and autoimmune conditions.  Hepatitis C blood testing is recommended for all people born from 1945 through 1965 and any individual with known risks for hepatitis C.  Practice safe sex. Use condoms and avoid high-risk sexual practices to reduce the spread of sexually transmitted infections (STIs). STIs include gonorrhea, chlamydia,  syphilis, trichomonas, herpes, HPV, and human immunodeficiency virus (HIV). Herpes, HIV, and HPV are viral illnesses that have no cure. They can result in disability, cancer, and death.  You should be screened for sexually transmitted illnesses (STIs) including gonorrhea and chlamydia if:  You are sexually active and are younger than 24 years.  You are older than 24 years and your health care provider tells you that you are at risk for this type of infection.  Your sexual activity has changed since you were last screened and you are at an increased risk for chlamydia or gonorrhea. Ask your health care provider if you are at risk.  If you are at risk of being infected with HIV, it is recommended that you take a prescription medicine daily to prevent HIV infection. This is called preexposure prophylaxis (PrEP). You are considered at risk if:  You are a heterosexual woman, are sexually active, and are at increased risk for HIV infection.  You take drugs by injection.  You are sexually active with a partner who has HIV.  Talk with your health care provider about whether you are at high risk of being infected with HIV. If you choose to begin PrEP, you should first be tested for HIV. You should then be tested every 3 months for as long as you are taking PrEP.  Osteoporosis is a disease in which the bones lose minerals and strength with aging. This can result in serious bone fractures or breaks. The risk of osteoporosis can be identified using a bone density scan. Women ages 65 years and over and women at risk for fractures or osteoporosis should discuss screening with their health care providers. Ask your health care provider whether you should take a calcium supplement or vitamin D to reduce the rate of osteoporosis.  Menopause can be associated with physical symptoms and risks. Hormone replacement therapy is available to decrease symptoms and risks. You should talk to your health care provider  about whether hormone replacement therapy is right for you.  Use sunscreen. Apply sunscreen liberally and repeatedly throughout the day. You should seek shade when your shadow   is shorter than you. Protect yourself by wearing long sleeves, pants, a wide-brimmed hat, and sunglasses year round, whenever you are outdoors.  Once a month, do a whole body skin exam, using a mirror to look at the skin on your back. Tell your health care provider of new moles, moles that have irregular borders, moles that are larger than a pencil eraser, or moles that have changed in shape or color.  Stay current with required vaccines (immunizations).  Influenza vaccine. All adults should be immunized every year.  Tetanus, diphtheria, and acellular pertussis (Td, Tdap) vaccine. Pregnant women should receive 1 dose of Tdap vaccine during each pregnancy. The dose should be obtained regardless of the length of time since the last dose. Immunization is preferred during the 27th-36th week of gestation. An adult who has not previously received Tdap or who does not know her vaccine status should receive 1 dose of Tdap. This initial dose should be followed by tetanus and diphtheria toxoids (Td) booster doses every 10 years. Adults with an unknown or incomplete history of completing a 3-dose immunization series with Td-containing vaccines should begin or complete a primary immunization series including a Tdap dose. Adults should receive a Td booster every 10 years.  Varicella vaccine. An adult without evidence of immunity to varicella should receive 2 doses or a second dose if she has previously received 1 dose. Pregnant females who do not have evidence of immunity should receive the first dose after pregnancy. This first dose should be obtained before leaving the health care facility. The second dose should be obtained 4-8 weeks after the first dose.  Human papillomavirus (HPV) vaccine. Females aged 13-26 years who have not received  the vaccine previously should obtain the 3-dose series. The vaccine is not recommended for use in pregnant females. However, pregnancy testing is not needed before receiving a dose. If a female is found to be pregnant after receiving a dose, no treatment is needed. In that case, the remaining doses should be delayed until after the pregnancy. Immunization is recommended for any person with an immunocompromised condition through the age of 26 years if she did not get any or all doses earlier. During the 3-dose series, the second dose should be obtained 4-8 weeks after the first dose. The third dose should be obtained 24 weeks after the first dose and 16 weeks after the second dose.  Zoster vaccine. One dose is recommended for adults aged 60 years or older unless certain conditions are present.  Measles, mumps, and rubella (MMR) vaccine. Adults born before 1957 generally are considered immune to measles and mumps. Adults born in 1957 or later should have 1 or more doses of MMR vaccine unless there is a contraindication to the vaccine or there is laboratory evidence of immunity to each of the three diseases. A routine second dose of MMR vaccine should be obtained at least 28 days after the first dose for students attending postsecondary schools, health care workers, or international travelers. People who received inactivated measles vaccine or an unknown type of measles vaccine during 1963-1967 should receive 2 doses of MMR vaccine. People who received inactivated mumps vaccine or an unknown type of mumps vaccine before 1979 and are at high risk for mumps infection should consider immunization with 2 doses of MMR vaccine. For females of childbearing age, rubella immunity should be determined. If there is no evidence of immunity, females who are not pregnant should be vaccinated. If there is no evidence of immunity, females who   are pregnant should delay immunization until after pregnancy. Unvaccinated health care  workers born before 1957 who lack laboratory evidence of measles, mumps, or rubella immunity or laboratory confirmation of disease should consider measles and mumps immunization with 2 doses of MMR vaccine or rubella immunization with 1 dose of MMR vaccine.  Pneumococcal 13-valent conjugate (PCV13) vaccine. When indicated, a person who is uncertain of her immunization history and has no record of immunization should receive the PCV13 vaccine. An adult aged 19 years or older who has certain medical conditions and has not been previously immunized should receive 1 dose of PCV13 vaccine. This PCV13 should be followed with a dose of pneumococcal polysaccharide (PPSV23) vaccine. The PPSV23 vaccine dose should be obtained at least 8 weeks after the dose of PCV13 vaccine. An adult aged 19 years or older who has certain medical conditions and previously received 1 or more doses of PPSV23 vaccine should receive 1 dose of PCV13. The PCV13 vaccine dose should be obtained 1 or more years after the last PPSV23 vaccine dose.  Pneumococcal polysaccharide (PPSV23) vaccine. When PCV13 is also indicated, PCV13 should be obtained first. All adults aged 65 years and older should be immunized. An adult younger than age 65 years who has certain medical conditions should be immunized. Any person who resides in a nursing home or long-term care facility should be immunized. An adult smoker should be immunized. People with an immunocompromised condition and certain other conditions should receive both PCV13 and PPSV23 vaccines. People with human immunodeficiency virus (HIV) infection should be immunized as soon as possible after diagnosis. Immunization during chemotherapy or radiation therapy should be avoided. Routine use of PPSV23 vaccine is not recommended for American Indians, [place] Natives, or people younger than 65 years unless there are medical conditions that require PPSV23 vaccine. When indicated, people who have unknown  immunization and have no record of immunization should receive PPSV23 vaccine. One-time revaccination 5 years after the first dose of PPSV23 is recommended for people aged 19-64 years who have chronic kidney failure, nephrotic syndrome, asplenia, or immunocompromised conditions. People who received 1-2 doses of PPSV23 before age 65 years should receive another dose of PPSV23 vaccine at age 65 years or later if at least 5 years have passed since the previous dose. Doses of PPSV23 are not needed for people immunized with PPSV23 at or after age 65 years.  Meningococcal vaccine. Adults with asplenia or persistent complement component deficiencies should receive 2 doses of quadrivalent meningococcal conjugate (MenACWY-D) vaccine. The doses should be obtained at least 2 months apart. Microbiologists working with certain meningococcal bacteria, military recruits, people at risk during an outbreak, and people who travel to or live in countries with a high rate of meningitis should be immunized. A first-year college student up through age 21 years who is living in a residence hall should receive a dose if she did not receive a dose on or after her 16th birthday. Adults who have certain high-risk conditions should receive one or more doses of vaccine.  Hepatitis A vaccine. Adults who wish to be protected from this disease, have certain high-risk conditions, work with hepatitis A-infected animals, work in hepatitis A research labs, or travel to or work in countries with a high rate of hepatitis A should be immunized. Adults who were previously unvaccinated and who anticipate close contact with an international adoptee during the first 60 days after arrival in the [place] from a country with a high rate of hepatitis A should be   immunized.  Hepatitis B vaccine. Adults who wish to be protected from this disease, have certain high-risk conditions, may be exposed to blood or other infectious body fluids, are household  contacts or sex partners of hepatitis B positive people, are clients or workers in certain care facilities, or travel to or work in countries with a high rate of hepatitis B should be immunized.  Haemophilus influenzae type b (Hib) vaccine. A previously unvaccinated person with asplenia or sickle cell disease or having a scheduled splenectomy should receive 1 dose of Hib vaccine. Regardless of previous immunization, a recipient of a hematopoietic stem cell transplant should receive a 3-dose series 6-12 months after her successful transplant. Hib vaccine is not recommended for adults with HIV infection. Preventive Services / Frequency Ages 19 to 39 years  Blood pressure check.** / Every 1 to 2 years.  Lipid and cholesterol check.** / Every 5 years beginning at age 20.  Clinical breast exam.** / Every 3 years for women in their 20s and 30s.  BRCA-related cancer risk assessment.** / For women who have family members with a BRCA-related cancer (breast, ovarian, tubal, or peritoneal cancers).  Pap test.** / Every 2 years from ages 21 through 29. Every 3 years starting at age 30 through age 65 or 70 with a history of 3 consecutive normal Pap tests.  HPV screening.** / Every 3 years from ages 30 through ages 65 to 70 with a history of 3 consecutive normal Pap tests.  Hepatitis C blood test.** / For any individual with known risks for hepatitis C.  Skin self-exam. / Monthly.  Influenza vaccine. / Every year.  Tetanus, diphtheria, and acellular pertussis (Tdap, Td) vaccine.** / Consult your health care provider. Pregnant women should receive 1 dose of Tdap vaccine during each pregnancy. 1 dose of Td every 10 years.  Varicella vaccine.** / Consult your health care provider. Pregnant females who do not have evidence of immunity should receive the first dose after pregnancy.  HPV vaccine. / 3 doses over 6 months, if 26 and younger. The vaccine is not recommended for use in pregnant females. However,  pregnancy testing is not needed before receiving a dose.  Measles, mumps, rubella (MMR) vaccine.** / You need at least 1 dose of MMR if you were born in 1957 or later. You may also need a 2nd dose. For females of childbearing age, rubella immunity should be determined. If there is no evidence of immunity, females who are not pregnant should be vaccinated. If there is no evidence of immunity, females who are pregnant should delay immunization until after pregnancy.  Pneumococcal 13-valent conjugate (PCV13) vaccine.** / Consult your health care provider.  Pneumococcal polysaccharide (PPSV23) vaccine.** / 1 to 2 doses if you smoke cigarettes or if you have certain conditions.  Meningococcal vaccine.** / 1 dose if you are age 19 to 21 years and a first-year college student living in a residence hall, or have one of several medical conditions, you need to get vaccinated against meningococcal disease. You may also need additional booster doses.  Hepatitis A vaccine.** / Consult your health care provider.  Hepatitis B vaccine.** / Consult your health care provider.  Haemophilus influenzae type b (Hib) vaccine.** / Consult your health care provider. Ages 40 to 64 years  Blood pressure check.** / Every 1 to 2 years.  Lipid and cholesterol check.** / Every 5 years beginning at age 20 years.  Lung cancer screening. / Every year if you are aged 55-80 years and have a   30-pack-year history of smoking and currently smoke or have quit within the past 15 years. Yearly screening is stopped once you have quit smoking for at least 15 years or develop a health problem that would prevent you from having lung cancer treatment.  Clinical breast exam.** / Every year after age 40 years.  BRCA-related cancer risk assessment.** / For women who have family members with a BRCA-related cancer (breast, ovarian, tubal, or peritoneal cancers).  Mammogram.** / Every year beginning at age 40 years and continuing for as long  as you are in good health. Consult with your health care provider.  Pap test.** / Every 3 years starting at age 30 years through age 65 or 70 years with a history of 3 consecutive normal Pap tests.  HPV screening.** / Every 3 years from ages 30 years through ages 65 to 70 years with a history of 3 consecutive normal Pap tests.  Fecal occult blood test (FOBT) of stool. / Every year beginning at age 50 years and continuing until age 75 years. You may not need to do this test if you get a colonoscopy every 10 years.  Flexible sigmoidoscopy or colonoscopy.** / Every 5 years for a flexible sigmoidoscopy or every 10 years for a colonoscopy beginning at age 50 years and continuing until age 75 years.  Hepatitis C blood test.** / For all people born from 1945 through 1965 and any individual with known risks for hepatitis C.  Skin self-exam. / Monthly.  Influenza vaccine. / Every year.  Tetanus, diphtheria, and acellular pertussis (Tdap/Td) vaccine.** / Consult your health care provider. Pregnant women should receive 1 dose of Tdap vaccine during each pregnancy. 1 dose of Td every 10 years.  Varicella vaccine.** / Consult your health care provider. Pregnant females who do not have evidence of immunity should receive the first dose after pregnancy.  Zoster vaccine.** / 1 dose for adults aged 60 years or older.  Measles, mumps, rubella (MMR) vaccine.** / You need at least 1 dose of MMR if you were born in 1957 or later. You may also need a 2nd dose. For females of childbearing age, rubella immunity should be determined. If there is no evidence of immunity, females who are not pregnant should be vaccinated. If there is no evidence of immunity, females who are pregnant should delay immunization until after pregnancy.  Pneumococcal 13-valent conjugate (PCV13) vaccine.** / Consult your health care provider.  Pneumococcal polysaccharide (PPSV23) vaccine.** / 1 to 2 doses if you smoke cigarettes or if you  have certain conditions.  Meningococcal vaccine.** / Consult your health care provider.  Hepatitis A vaccine.** / Consult your health care provider.  Hepatitis B vaccine.** / Consult your health care provider.  Haemophilus influenzae type b (Hib) vaccine.** / Consult your health care provider. Ages 65 years and over  Blood pressure check.** / Every 1 to 2 years.  Lipid and cholesterol check.** / Every 5 years beginning at age 20 years.  Lung cancer screening. / Every year if you are aged 55-80 years and have a 30-pack-year history of smoking and currently smoke or have quit within the past 15 years. Yearly screening is stopped once you have quit smoking for at least 15 years or develop a health problem that would prevent you from having lung cancer treatment.  Clinical breast exam.** / Every year after age 40 years.  BRCA-related cancer risk assessment.** / For women who have family members with a BRCA-related cancer (breast, ovarian, tubal, or peritoneal cancers).    Mammogram.** / Every year beginning at age 40 years and continuing for as long as you are in good health. Consult with your health care provider.  Pap test.** / Every 3 years starting at age 30 years through age 65 or 70 years with 3 consecutive normal Pap tests. Testing can be stopped between 65 and 70 years with 3 consecutive normal Pap tests and no abnormal Pap or HPV tests in the past 10 years.  HPV screening.** / Every 3 years from ages 30 years through ages 65 or 70 years with a history of 3 consecutive normal Pap tests. Testing can be stopped between 65 and 70 years with 3 consecutive normal Pap tests and no abnormal Pap or HPV tests in the past 10 years.  Fecal occult blood test (FOBT) of stool. / Every year beginning at age 50 years and continuing until age 75 years. You may not need to do this test if you get a colonoscopy every 10 years.  Flexible sigmoidoscopy or colonoscopy.** / Every 5 years for a flexible  sigmoidoscopy or every 10 years for a colonoscopy beginning at age 50 years and continuing until age 75 years.  Hepatitis C blood test.** / For all people born from 1945 through 1965 and any individual with known risks for hepatitis C.  Osteoporosis screening.** / A one-time screening for women ages 65 years and over and women at risk for fractures or osteoporosis.  Skin self-exam. / Monthly.  Influenza vaccine. / Every year.  Tetanus, diphtheria, and acellular pertussis (Tdap/Td) vaccine.** / 1 dose of Td every 10 years.  Varicella vaccine.** / Consult your health care provider.  Zoster vaccine.** / 1 dose for adults aged 60 years or older.  Pneumococcal 13-valent conjugate (PCV13) vaccine.** / Consult your health care provider.  Pneumococcal polysaccharide (PPSV23) vaccine.** / 1 dose for all adults aged 65 years and older.  Meningococcal vaccine.** / Consult your health care provider.  Hepatitis A vaccine.** / Consult your health care provider.  Hepatitis B vaccine.** / Consult your health care provider.  Haemophilus influenzae type b (Hib) vaccine.** / Consult your health care provider. ** Family history and personal history of risk and conditions may change your health care provider's recommendations. Document Released: 06/26/2001 Document Revised: 09/14/2013 Document Reviewed: 09/25/2010 [employer] Patient Information 2015 [employer]. This information is not intended to replace advice given to you by your health care provider. Make sure you discuss any questions you have with your health care provider.  

## 2014-11-23 LAB — BASIC METABOLIC PANEL
BUN: 9 mg/dL (ref 6–23)
CO2: 24 meq/L (ref 19–32)
Calcium: 10.1 mg/dL (ref 8.4–10.5)
Chloride: 104 mEq/L (ref 96–112)
Creatinine, Ser: 0.82 mg/dL (ref 0.40–1.20)
GFR: 93.04 mL/min (ref >60.00)
Glucose, Bld: 66 mg/dL — ABNORMAL LOW (ref 70–99)
Potassium: 4.1 mEq/L (ref 3.5–5.1)
Sodium: 137 mEq/L (ref 135–145)

## 2014-11-23 LAB — CBC WITH DIFFERENTIAL/PLATELET
BASOS ABS: 0 10*3/uL (ref 0.0–0.1)
Basophils Relative: 0.3 % (ref 0.0–3.0)
Eosinophils Absolute: 0.1 10*3/uL (ref 0.0–0.7)
Eosinophils Relative: 1.5 % (ref 0.0–5.0)
HEMATOCRIT: 39.6 % (ref 36.0–46.0)
Hemoglobin: 13.3 g/dL (ref 12.0–15.0)
LYMPHS PCT: 38 % (ref 12.0–46.0)
Lymphs Abs: 2.5 10*3/uL (ref 0.7–4.0)
MCHC: 33.4 g/dL (ref 30.0–36.0)
MCV: 85.5 fl (ref 78.0–100.0)
Monocytes Absolute: 0.4 10*3/uL (ref 0.1–1.0)
Monocytes Relative: 6.1 % (ref 3.0–12.0)
Neutro Abs: 3.6 10*3/uL (ref 1.4–7.7)
Neutrophils Relative %: 54.1 % (ref 43.0–77.0)
Platelets: 272 10*3/uL (ref 150.0–400.0)
RBC: 4.64 Mil/uL (ref 3.87–5.11)
RDW: 14.3 % (ref 11.5–15.5)
WBC: 6.7 10*3/uL (ref 4.0–10.5)

## 2014-11-23 LAB — LIPID PANEL
Cholesterol: 219 mg/dL — ABNORMAL HIGH (ref 0–200)
HDL: 61.9 mg/dL (ref >39.00)
LDL Cholesterol: 133 mg/dL — ABNORMAL HIGH (ref 0–99)
NonHDL: 157.1
Total CHOL/HDL Ratio: 4
Triglycerides: 119 mg/dL (ref 0.0–149.0)
VLDL: 23.8 mg/dL (ref 0.0–40.0)

## 2014-11-23 LAB — HEPATIC FUNCTION PANEL
ALBUMIN: 4.1 g/dL (ref 3.5–5.2)
ALK PHOS: 56 U/L (ref 39–117)
ALT: 15 U/L (ref 0–35)
AST: 21 U/L (ref 0–37)
Bilirubin, Direct: 0.1 mg/dL (ref 0.0–0.3)
TOTAL PROTEIN: 7.7 g/dL (ref 6.0–8.3)
Total Bilirubin: 0.4 mg/dL (ref 0.2–1.2)

## 2014-11-23 LAB — TSH: TSH: 2.04 u[IU]/mL (ref 0.35–4.50)

## 2014-11-24 LAB — CERVICOVAGINAL ANCILLARY ONLY
BACTERIAL VAGINITIS: NEGATIVE
CANDIDA VAGINITIS: NEGATIVE

## 2014-11-24 LAB — POCT URINALYSIS DIPSTICK
Bilirubin, UA: NEGATIVE
Blood, UA: NEGATIVE
GLUCOSE UA: NEGATIVE
Ketones, UA: POSITIVE
Leukocytes, UA: NEGATIVE
Nitrite, UA: NEGATIVE
Protein, UA: NEGATIVE
Spec Grav, UA: 1.025
UROBILINOGEN UA: 0.2
pH, UA: 6

## 2014-11-24 NOTE — Addendum Note (Signed)
Addended by: Eustace QuailEABOLD, Johnathon Olden J on: 11/24/2014 03:54 PM   Modules accepted: Orders

## 2014-11-26 LAB — CYTOLOGY - PAP

## 2014-11-27 LAB — URINE CULTURE: Colony Count: >=100000

## 2014-11-29 LAB — CERVICOVAGINAL ANCILLARY ONLY: Herpes: NEGATIVE

## 2014-11-30 ENCOUNTER — Telehealth: Payer: Self-pay | Admitting: Family Medicine

## 2014-11-30 NOTE — Telephone Encounter (Signed)
Relation to pt: self Call back number: [phone]   Reason for call:   Patient was seen for physical appointment 11/22/14 and would like to know when she should schedule 2nd shot of meningitis and would like to schedule Tdap. Please advise

## 2014-11-30 NOTE — Telephone Encounter (Signed)
She had tdap in 2010 so does not need that yet. She can get meningitis now--- its been > 4 years since last but we normally give second one 16-18 so they may want to make sure the ins will pay for it ---- usually 1 after 18 is all that is recommended

## 2014-11-30 NOTE — Telephone Encounter (Signed)
Please review chart and advise     KP 

## 2014-12-02 NOTE — Telephone Encounter (Signed)
Patient scheduled to have Men B on 12/24/14 at 10:15.     KP

## 2014-12-24 ENCOUNTER — Encounter: Payer: Self-pay | Admitting: Neurology

## 2014-12-24 ENCOUNTER — Ambulatory Visit (INDEPENDENT_AMBULATORY_CARE_PROVIDER_SITE_OTHER): Payer: Commercial Managed Care - PPO

## 2014-12-24 ENCOUNTER — Ambulatory Visit (INDEPENDENT_AMBULATORY_CARE_PROVIDER_SITE_OTHER): Payer: Commercial Managed Care - PPO | Admitting: Neurology

## 2014-12-24 VITALS — BP 112/70 | HR 60 | Ht 64.0 in | Wt 171.0 lb

## 2014-12-24 DIAGNOSIS — R519 Headache, unspecified: Secondary | ICD-10-CM | POA: Insufficient documentation

## 2014-12-24 DIAGNOSIS — Z23 Encounter for immunization: Secondary | ICD-10-CM

## 2014-12-24 DIAGNOSIS — R51 Headache: Secondary | ICD-10-CM | POA: Diagnosis not present

## 2014-12-24 DIAGNOSIS — G43009 Migraine without aura, not intractable, without status migrainosus: Secondary | ICD-10-CM | POA: Diagnosis not present

## 2014-12-24 MED ORDER — TOPIRAMATE 25 MG PO TABS: ORAL_TABLET | ORAL | Status: DC

## 2014-12-24 MED ORDER — SUMATRIPTAN SUCCINATE 25 MG PO TABS: ORAL_TABLET | ORAL | Status: AC

## 2014-12-24 NOTE — Patient Instructions (Signed)
1. Schedule MRI brain without contrast in [place], [place] 2. Start Topiramate : Take 1 tablet at bedtime 3. Take Sumatriptan (Imitrex)  1 tablet at onset of migraine, may take second dose after 2 hours. Do not take more than 3 a week 4. Minimize Excedrin intake to 2-3 a week to avoid rebound headaches 5. Discuss tri-cycling with your gynecologist (taking sugar pills every 3 months instead of every month) 6. Keep a calendar of your headaches 7. Follow-up in 3 months, call for any changes

## 2014-12-24 NOTE — Progress Notes (Signed)
NEUROLOGY CONSULTATION NOTE  Marcel Sorter MRN: [id number] DOB: 12-07-1990  Referring provider: Dr. Loreen Freud Primary care provider: Dr. Loreen Freud  Reason for consult:  migraines  Dear Dr Laury Axon:  Thank you for your kind referral of Siria Calandro for consultation of the above symptoms. Although her history is well known to you, please allow me to reiterate it for the purpose of our medical record. Records and images were personally reviewed where available.  HISTORY OF PRESENT ILLNESS: This is a pleasant 24 year old right-handed woman with a history of migraines since high school, presenting with a change in headaches and increased frequency of headaches. She has migraines around 3 times a month, with diffuse throbbing 10/10 pain with associated nausea and significant photophobia, and dizziness. Over the past year or so, she has been having a different kind of headache that has been occurring every other day. These headaches are over the frontal region, with 6 to 7 over 10 pressure and throbbing pain, lasting 2-3 hours with mild photo and phonophobia, no nausea or vomiting. She has also noticed a change in her migraines, in the past there was no catamenial component, but recently she would have migraines daily the week of her period. She has been taking 2 tablets of Excedrin 3 times a week on average. She has noticed that stress is a trigger for the migraines, she had less this summer vacation. She usually gets 8-9 hours of sleep but always feels tired. She denies any snoring. She denies any diplopia, dysarthria, dysphagia, focal numbness/tingling/weakness, neck/back pain, bowel/bladder dysfunction. She was given a prescription for Relpax, which did help with her migraine, but is too cost-prohibitive. There is a strong family history of migraines in her mother, father, and maternal grandmother. She denies any significant head injuries. No change in medications, she has been on the same  birth control since age 18. She denies having any brain imaging in the past.   Laboratory Data: Lab Results  Component Value Date   WBC 6.7 11/22/2014   HGB 13.3 11/22/2014   HCT 39.6 11/22/2014   MCV 85.5 11/22/2014   PLT 272.0 11/22/2014     Chemistry      Component Value Date/Time   NA 137 11/22/2014 1511   K 4.1 11/22/2014 1511   CL 104 11/22/2014 1511   CO2 24 11/22/2014 1511   BUN 9 11/22/2014 1511   CREATININE 0.82 11/22/2014 1511      Component Value Date/Time   CALCIUM 10.1 11/22/2014 1511   ALKPHOS 56 11/22/2014 1511   AST 21 11/22/2014 1511   ALT 15 11/22/2014 1511   BILITOT 0.4 11/22/2014 1511       PAST MEDICAL HISTORY: Past Medical History  Diagnosis Date  . Migraines     PAST SURGICAL HISTORY: Past Surgical History  Procedure Laterality Date  . No past surgeries      MEDICATIONS: Current Outpatient Prescriptions on File Prior to Visit  Medication Sig Dispense Refill  . eletriptan (RELPAX) 20 MG tablet Take 1 tablet (20 mg total) by mouth as needed for migraine or headache. May repeat in 2 hours if headache persists or recurs. 10 tablet 0  . norethindrone-ethinyl estradiol (JUNEL FE 1/20) 1-20 MG-MCG tablet Take 1 tablet by mouth daily. 28 tablet 11   No current facility-administered medications on file prior to visit.    ALLERGIES: No Known Allergies  FAMILY HISTORY: Family History  Problem Relation Age of Onset  . Diabetes Maternal Grandmother   .  Hyperlipidemia Maternal Grandmother   . Hypertension Maternal Grandmother   . ADD / ADHD Mother     SOCIAL HISTORY: Social History   Social History  . Marital Status: Single    Spouse Name: N/A  . Number of Children: N/A  . Years of Education: N/A   Occupational History  . Not on file.   Social History Main Topics  . Smoking status: Never Smoker   . Smokeless tobacco: Never Used  . Alcohol Use: 0.0 oz/week    0 Standard drinks or equivalent per week     Comment: once a week  .  Drug Use: No  . Sexual Activity:    Partners: Male    Birth Control/ Protection: Pill   Other Topics Concern  . Not on file   Social History Narrative    REVIEW OF SYSTEMS: Constitutional: No fevers, chills, or sweats, no generalized fatigue, change in appetite Eyes: No visual changes, double vision, eye pain Ear, nose and throat: No hearing loss, ear pain, nasal congestion, sore throat Cardiovascular: No chest pain, palpitations Respiratory:  No shortness of breath at rest or with exertion, wheezes GastrointestinaI: No nausea, vomiting, diarrhea, abdominal pain, fecal incontinence Genitourinary:  No dysuria, urinary retention or frequency Musculoskeletal:  No neck pain, back pain Integumentary: No rash, pruritus, skin lesions Neurological: as above Psychiatric: No depression, insomnia, anxiety Endocrine: No palpitations, fatigue, diaphoresis, mood swings, change in appetite, change in weight, increased thirst Hematologic/Lymphatic:  No anemia, purpura, petechiae. Allergic/Immunologic: no itchy/runny eyes, nasal congestion, recent allergic reactions, rashes  PHYSICAL EXAM: Filed Vitals:   12/24/14 1242  BP: 112/70  Pulse: 60   General: No acute distress Head:  Normocephalic/atraumatic Eyes: Fundoscopic exam shows bilateral sharp discs, no vessel changes, exudates, or hemorrhages Neck: supple, no paraspinal tenderness, full range of motion Back: No paraspinal tenderness Heart: regular rate and rhythm Lungs: Clear to auscultation bilaterally. Vascular: No carotid bruits. Skin/Extremities: No rash, no edema Neurological Exam: Mental status: alert and oriented to person, place, and time, no dysarthria or aphasia, Fund of knowledge is appropriate.  Recent and remote memory are intact.  Attention and concentration are normal.    Able to name objects and repeat phrases. Cranial nerves: CN I: not tested CN II: pupils equal, round and reactive to light, visual fields intact,  fundi unremarkable. CN III, IV, VI:  full range of motion, no nystagmus, no ptosis CN V: decreased light touch and cold on the right V2-3, decreased pin on right V1, did not split midline with pin or tuning fork CN VII: upper and lower face symmetric CN VIII: hearing intact to finger rub CN IX, X: gag intact, uvula midline CN XI: sternocleidomastoid and trapezius muscles intact CN XII: tongue midline Bulk & Tone: normal, no fasciculations. Motor: 5/5 throughout with no pronator drift. Sensation: intact to light touch, cold, pin, vibration and joint position sense.  No extinction to double simultaneous stimulation.  Romberg test negative Deep Tendon Reflexes: +1 on both UE, +2 both LE, no ankle clonus Plantar responses: downgoing bilaterally Cerebellar: no incoordination on finger to nose, heel to shin. No dysdiadochokinesia Gait: narrow-based and steady, able to tandem walk adequately. Tremor: none  IMPRESSION: This is a pleasant 24 year old right-handed woman with a history of migraines since high school, presenting with a change in headaches, now occurring on a near-daily basis. Her neurological exam shows decreased sensation over the right side of her face. MRI brain without contrast will be ordered to assess for underlying  structural abnormality. We discussed that she is a good candidate for starting headache preventative medication, options were discussed, she is concerned about weight gain with the other medications, and would like to start Topiramate. She will start low dose 25mg  qhs, side effects, including potential effects on the unborn fetus, were discussed. She has no pregnancy plans at this time. We discussed speaking to her gynecologist about tri-cycling her OCP, to see if this will help with catamenial migraines. She was instructed to minimize intake of Excedrin to 2-3 a week to avoid rebound headaches. Relpax is cost-prohibitive, she was given a prescription for sumatriptan 25mg  to  take at onset of migraine, side effects were discussed. She will keep a headache diary and follow-up in 3 months.   Thank you for allowing me to participate in the care of this patient. Please do not hesitate to call for any questions or concerns.   Patrcia Dolly, M.D.  CC: Dr. Laury Axon

## 2014-12-24 NOTE — Progress Notes (Signed)
Pre visit review using our clinic review tool, if applicable. No additional management support is needed unless otherwise documented below in the visit note.  Patient tolerated injection well.  

## 2014-12-25 ENCOUNTER — Ambulatory Visit (HOSPITAL_BASED_OUTPATIENT_CLINIC_OR_DEPARTMENT_OTHER)
Admission: RE | Admit: 2014-12-25 | Discharge: 2014-12-25 | Disposition: A | Discharge status: Home / Self Care | Payer: Commercial Managed Care - PPO | Source: Ambulatory Visit | Attending: Neurology | Admitting: Neurology

## 2014-12-25 DIAGNOSIS — R51 Headache: Secondary | ICD-10-CM | POA: Diagnosis not present

## 2014-12-25 DIAGNOSIS — R42 Dizziness and giddiness: Secondary | ICD-10-CM | POA: Diagnosis not present

## 2014-12-28 ENCOUNTER — Telehealth: Payer: Self-pay | Admitting: Family Medicine

## 2014-12-28 NOTE — Telephone Encounter (Signed)
-----   Message from Van Clines, MD sent at 12/28/2014  8:34 AM EDT ----- Please let patient know I reviewed MRI brain and it is normal, no evidence of tumor, stroke, bleed. thanks

## 2014-12-28 NOTE — Telephone Encounter (Signed)
Lmovm to return my call. 

## 2014-12-28 NOTE — Telephone Encounter (Signed)
Patient returning you call 

## 2014-12-28 NOTE — Telephone Encounter (Signed)
Called patient back and notified her of results.

## 2015-01-13 ENCOUNTER — Encounter: Payer: Self-pay | Admitting: Family Medicine

## 2015-04-06 ENCOUNTER — Ambulatory Visit (INDEPENDENT_AMBULATORY_CARE_PROVIDER_SITE_OTHER): Payer: Commercial Managed Care - PPO | Admitting: Neurology

## 2015-04-06 ENCOUNTER — Encounter: Payer: Self-pay | Admitting: Neurology

## 2015-04-06 ENCOUNTER — Ambulatory Visit: Payer: Commercial Managed Care - PPO | Admitting: Neurology

## 2015-04-06 VITALS — BP 130/82 | HR 49 | Ht 64.0 in | Wt 168.0 lb

## 2015-04-06 DIAGNOSIS — G43009 Migraine without aura, not intractable, without status migrainosus: Secondary | ICD-10-CM

## 2015-04-06 MED ORDER — TOPIRAMATE 25 MG PO TABS: ORAL_TABLET | ORAL | Status: DC

## 2015-04-06 NOTE — Progress Notes (Signed)
NEUROLOGY FOLLOW UP OFFICE NOTE  Jordan Dominguez [id number]  HISTORY OF PRESENT ILLNESS: I had the pleasure of seeing Jordan Dominguez in follow-up in the neurology clinic on 04/06/2015.  The patient was last seen 3 months ago for worsening migraines occurring on a near-daily basis. Records and images were personally reviewed where available.  I personally reviewed MRI brain without contrast which was normal. She started low dose Topamax  qhs. She has noticed a decrease in migraine and regular headache frequency, she reports barely having migraines now, her calendar shows 1 in September, 2 in November where she was sick all day vomiting with last migraine on 11/7. She feels she took the Imitrex too late because it did not help, Excedrin helped better. She still has the regular headaches, but they are not as bad and do not last as long. She had to take an Excedrin yesterday. She is tolerating Topamax well except for occasional tingling in her cheeks. No difficulties in school. She is noted to have bradycardia at 48-55 today, asymptomatic, she denies any chest pain or shortness of breath. There is a catamenial component to her migraines, but she has not seen her OB yet to discuss tricycling.   HPI 12/24/14: This is a pleasant 24 yo RH woman with a history of migraines since high school, who presented with change in headaches and increased frequency of headaches. She has migraines around 3 times a month, with diffuse throbbing 10/10 pain with associated nausea and significant photophobia, and dizziness. Over the past year or so, she has been having a different kind of headache that has been occurring every other day. These headaches are over the frontal region, with 6 to 7 over 10 pressure and throbbing pain, lasting 2-3 hours with mild photo and phonophobia, no nausea or vomiting. She has also noticed a change in her migraines, in the past there was no catamenial component, but recently she would have  migraines daily the week of her period. She has been taking 2 tablets of Excedrin 3 times a week on average. She has noticed that stress is a trigger for the migraines, she had less this summer vacation. She usually gets 8-9 hours of sleep but always feels tired. She denies any snoring. She denies any diplopia, dysarthria, dysphagia, focal numbness/tingling/weakness, neck/back pain, bowel/bladder dysfunction. She was given a prescription for Relpax, which did help with her migraine, but is too cost-prohibitive. There is a strong family history of migraines in her mother, father, and maternal grandmother. She denies any significant head injuries. No change in medications, she has been on the same birth control since age 24.  PAST MEDICAL HISTORY: Past Medical History  Diagnosis Date  . Migraines     MEDICATIONS: Current Outpatient Prescriptions on File Prior to Visit  Medication Sig Dispense Refill  . eletriptan (RELPAX) 20 MG tablet Take 1 tablet (20 mg total) by mouth as needed for migraine or headache. May repeat in 2 hours if headache persists or recurs. 10 tablet 0  . norethindrone-ethinyl estradiol (JUNEL FE 1/20) 1-20 MG-MCG tablet Take 1 tablet by mouth daily. 28 tablet 11  . SUMAtriptan (IMITREX) 25 MG tablet Take 1 tablet at onset of migraine. May take second dose after 2 hours. Do not take more than 3 a week. 10 tablet 5  . topiramate (TOPAMAX) 25 MG tablet Take 1 tablet at bedtime 30 tablet 5   No current facility-administered medications on file prior to visit.    ALLERGIES: No Known Allergies  FAMILY HISTORY: Family History  Problem Relation Age of Onset  . Diabetes Maternal Grandmother   . Hyperlipidemia Maternal Grandmother   . Hypertension Maternal Grandmother   . ADD / ADHD Mother     SOCIAL HISTORY: Social History   Social History  . Marital Status: Single    Spouse Name: N/A  . Number of Children: N/A  . Years of Education: N/A   Occupational History  . Not  on file.   Social History Main Topics  . Smoking status: Never Smoker   . Smokeless tobacco: Never Used  . Alcohol Use: 0.0 oz/week    0 Standard drinks or equivalent per week     Comment: once a week  . Drug Use: No  . Sexual Activity:    Partners: Male    Birth Control/ Protection: Pill   Other Topics Concern  . Not on file   Social History Narrative    REVIEW OF SYSTEMS: Constitutional: No fevers, chills, or sweats, no generalized fatigue, change in appetite Eyes: No visual changes, double vision, eye pain Ear, nose and throat: No hearing loss, ear pain, nasal congestion, sore throat Cardiovascular: No chest pain, palpitations Respiratory:  No shortness of breath at rest or with exertion, wheezes GastrointestinaI: No nausea, vomiting, diarrhea, abdominal pain, fecal incontinence Genitourinary:  No dysuria, urinary retention or frequency Musculoskeletal:  No neck pain, back pain Integumentary: No rash, pruritus, skin lesions Neurological: as above Psychiatric: No depression, insomnia, anxiety Endocrine: No palpitations, fatigue, diaphoresis, mood swings, change in appetite, change in weight, increased thirst Hematologic/Lymphatic:  No anemia, purpura, petechiae. Allergic/Immunologic: no itchy/runny eyes, nasal congestion, recent allergic reactions, rashes  PHYSICAL EXAM: Filed Vitals:   04/06/15 1220  BP: 130/82  Pulse: 49   General: No acute distress Head:  Normocephalic/atraumatic Neck: supple, no paraspinal tenderness, full range of motion Heart:  Regular rate and rhythm Lungs:  Clear to auscultation bilaterally Back: No paraspinal tenderness Skin/Extremities: No rash, no edema Neurological Exam: alert and oriented to person, place, and time. No aphasia or dysarthria. Fund of knowledge is appropriate.  Recent and remote memory are intact.  Attention and concentration are normal.    Able to name objects and repeat phrases. Cranial nerves: Pupils equal, round,  reactive to light.  Fundoscopic exam unremarkable, no papilledema. Extraocular movements intact with no nystagmus. Visual fields full. Facial sensation intact. No facial asymmetry. Tongue, uvula, palate midline.  Motor: Bulk and tone normal, muscle strength 5/5 throughout with no pronator drift.  Sensation to light touch intact.  No extinction to double simultaneous stimulation.  Deep tendon reflexes 2+ throughout, toes downgoing.  Finger to nose testing intact.  Gait narrow-based and steady, able to tandem walk adequately.  Romberg negative.  IMPRESSION: This is a pleasant 24 yo RH woman with a history of migraines since high school, with worsening migraines and headaches. MRI brain normal. She has had a fair response to low dose Topamax and will increase dose to  qhs. Continue to monitor for any side effects. She did not find Imitrex helpful and will take Excedrin instead, knowing to minimize rescue medication to 2-3 a week to avoid rebound headaches. She has no pregnancy plans at this time. She will continue her headache diary and follow-up in 3 months. She is noted to have asymptomatic bradycardia 48-55 in the office today, she will continue to monitor and speak to PCP if persists.  Thank you for allowing me to participate in her care.  Please do not hesitate to  call for any questions or concerns.  The duration of this appointment visit was 24 minutes of face-to-face time with the patient.  Greater than 50% of this time was spent in counseling, explanation of diagnosis, planning of further management, and coordination of care.   Jordan DollyKaren Sarayu Dominguez, M.D.   CC: Dr. Laury AxonLowne

## 2015-04-06 NOTE — Patient Instructions (Signed)
1. Increase Topamax 25mg : take 2 tablets at night 2. Take Excedrin as needed for migraine, do not take more than 2-3 a week to avoid rebound headaches 3. Continue headache calendar 4. Follow-up in 3 months, call for any problems

## 2015-07-18 ENCOUNTER — Encounter: Payer: Self-pay | Admitting: Medical

## 2015-07-18 ENCOUNTER — Ambulatory Visit (INDEPENDENT_AMBULATORY_CARE_PROVIDER_SITE_OTHER): Payer: Commercial Managed Care - PPO | Admitting: Medical

## 2015-07-18 VITALS — BP 120/78 | HR 66 | Temp 97.8°F | Ht 64.0 in | Wt 157.0 lb

## 2015-07-18 DIAGNOSIS — R05 Cough: Secondary | ICD-10-CM | POA: Diagnosis not present

## 2015-07-18 DIAGNOSIS — H938X3 Other specified disorders of ear, bilateral: Secondary | ICD-10-CM | POA: Diagnosis not present

## 2015-07-18 DIAGNOSIS — J029 Acute pharyngitis, unspecified: Secondary | ICD-10-CM

## 2015-07-18 DIAGNOSIS — J0100 Acute maxillary sinusitis, unspecified: Secondary | ICD-10-CM

## 2015-07-18 DIAGNOSIS — R059 Cough, unspecified: Secondary | ICD-10-CM

## 2015-07-18 LAB — POCT RAPID STREP A (OFFICE): Rapid Strep A Screen: NEGATIVE

## 2015-07-18 MED ORDER — FLUTICASONE PROPIONATE 50 MCG/ACT NA SUSP: 2.0000 | Freq: Every day | NASAL | Status: DC

## 2015-07-18 MED ORDER — CEPHALEXIN 500 MG PO CAPS
500.0000 mg | ORAL_CAPSULE | Freq: Two times a day (BID) | ORAL | Status: AC
Start: 2015-07-18 — End: ?

## 2015-07-18 MED ORDER — CEPHALEXIN 500 MG PO CAPS: 500.0000 mg | ORAL_CAPSULE | Freq: Two times a day (BID) | ORAL | Status: DC

## 2015-07-18 MED ORDER — FLUTICASONE PROPIONATE 50 MCG/ACT NA SUSP: 2.0000 | Freq: Every day | NASAL | Status: AC

## 2015-07-18 MED ORDER — BENZONATATE 100 MG PO CAPS: 100.0000 mg | ORAL_CAPSULE | Freq: Three times a day (TID) | ORAL | Status: AC | PRN

## 2015-07-18 MED ORDER — BENZONATATE 100 MG PO CAPS: 100.0000 mg | ORAL_CAPSULE | Freq: Three times a day (TID) | ORAL | Status: DC | PRN

## 2015-07-18 NOTE — Progress Notes (Signed)
Subjective:    Patient ID: Jordan Dominguez, female    DOB: [birth year], 25 y.o.   MRN: [id number]  HPI  Pt in 2 days of nasal congestion, ear pressure, mild sore throat, hoarse voice and some sinus pressure.  Some productive cough and some sinus pressure.  Some sneezing.   LMP- 2 weeks ago.   Review of Systems  Constitutional: Negative for fever, chills and fatigue.  HENT: Positive for congestion, ear pain, postnasal drip, sinus pressure, sneezing and sore throat.   Respiratory: Positive for cough. Negative for choking, shortness of breath and wheezing.   Cardiovascular: Negative for chest pain and palpitations.  Gastrointestinal: Positive for abdominal pain.  Genitourinary: Negative for difficulty urinating.  Musculoskeletal: Negative for myalgias and back pain.  Neurological: Negative for dizziness, numbness and headaches.  Hematological: Negative for adenopathy. Does not bruise/bleed easily.  Psychiatric/Behavioral: Negative for behavioral problems, confusion and dysphoric mood.    Past Medical History  Diagnosis Date  . Migraines     Social History   Social History  . Marital Status: Single    Spouse Name: N/A  . Number of Children: N/A  . Years of Education: N/A   Occupational History  . Not on file.   Social History Main Topics  . Smoking status: Never Smoker   . Smokeless tobacco: Never Used  . Alcohol Use: 0.0 oz/week    0 Standard drinks or equivalent per week     Comment: once a week  . Drug Use: No  . Sexual Activity:    Partners: Male    Birth Control/ Protection: Pill   Other Topics Concern  . Not on file   Social History Narrative    Past Surgical History  Procedure Laterality Date  . No past surgeries      Family History  Problem Relation Age of Onset  . Diabetes Maternal Grandmother   . Hyperlipidemia Maternal Grandmother   . Hypertension Maternal Grandmother   . ADD / ADHD Mother     No Known Allergies  Current Outpatient  Prescriptions on File Prior to Visit  Medication Sig Dispense Refill  . eletriptan (RELPAX) 20 MG tablet Take 1 tablet (20 mg total) by mouth as needed for migraine or headache. May repeat in 2 hours if headache persists or recurs. 10 tablet 0  . norethindrone-ethinyl estradiol (JUNEL FE 1/20) 1-20 MG-MCG tablet Take 1 tablet by mouth daily. 28 tablet 11  . SUMAtriptan (IMITREX) 25 MG tablet Take 1 tablet at onset of migraine. May take second dose after 2 hours. Do not take more than 3 a week. 10 tablet 5  . topiramate (TOPAMAX) 25 MG tablet Take 2 tablets at bedtime 60 tablet 5   No current facility-administered medications on file prior to visit.    BP 120/78 mmHg  Pulse 66  Temp(Src) 97.8 F (36.6 C) (Oral)  Ht 5\' 4"  (1.626 m)  Wt 157 lb (71.215 kg)  BMI 26.94 kg/m2  SpO2 98%       Objective:   Physical Exam   General  Mental Status - Alert. General Appearance - Well groomed. Not in acute distress.  Skin Rashes- No Rashes.  HEENT Head- Normal. Ear Auditory Canal - Left- Normal. Right - Normal.Tympanic Membrane- Left- Normal. Right- Normal. Eye Sclera/Conjunctiva- Left- Normal. Right- Normal. Nose & Sinuses Nasal Mucosa- Left-  Boggy and Congested. Right-  Boggy and  Congested.Bilateral maxillary and frontal sinus pressure. Mouth & Throat Lips: Upper Lip- Normal: no dryness, cracking, pallor, cyanosis,  or vesicular eruption. Lower Lip-Normal: no dryness, cracking, pallor, cyanosis or vesicular eruption. Buccal Mucosa- Bilateral- No Aphthous ulcers. Oropharynx- No Discharge or Erythema. Tonsils: Characteristics- Bilateral- Erythema and  Congestion. Size/Enlargement- Bilateral- 1+ enlargement. Discharge- bilateral-None.  Neck Neck- Supple. No Masses.   Chest and Lung Exam Auscultation: Breath Sounds:-Clear even and unlabored.  Cardiovascular Auscultation:Rythm- Regular, rate and rhythm. Murmurs & Other Heart Sounds:Ausculatation of the heart reveal- No  Murmurs.  Lymphatic Head & Neck General Head & Neck Lymphatics: Bilateral: Description- No Localized lymphadenopathy.      Assessment & Plan:  You appear to have a sinus infection. I am prescribing  Cephalexin antibiotic for the infection. To help with the nasal congestion I prescribed flonase  nasal steroid. For your associated cough, I prescribed cough medicine benzonatate.  You had rapid strep test done in our office today. Antibiotic for sinus can treat strep as well.  Rest, hydrate, tylenol for fever.  Follow up in 7 days or as needed.

## 2015-07-18 NOTE — Progress Notes (Signed)
Pre visit review using our clinic review tool, if applicable. No additional management support is needed unless otherwise documented below in the visit note. 

## 2015-07-18 NOTE — Patient Instructions (Addendum)
You appear to have a sinus infection. I am prescribing  Cephalexin antibiotic for the infection. To help with the nasal congestion I prescribed flonase  nasal steroid. For your associated cough, I prescribed cough medicine benzonatate.  You had rapid strep test done in our office today. Antibiotic for sinus can treat strep as well.  Rest, hydrate, tylenol for fever.  Follow up in 7 days or as needed.

## 2015-08-01 ENCOUNTER — Ambulatory Visit (INDEPENDENT_AMBULATORY_CARE_PROVIDER_SITE_OTHER): Payer: Commercial Managed Care - PPO | Admitting: Neurology

## 2015-08-01 ENCOUNTER — Encounter: Payer: Self-pay | Admitting: Neurology

## 2015-08-01 VITALS — BP 130/76 | HR 96 | Ht 64.0 in | Wt 156.0 lb

## 2015-08-01 DIAGNOSIS — G43009 Migraine without aura, not intractable, without status migrainosus: Secondary | ICD-10-CM | POA: Diagnosis not present

## 2015-08-01 MED ORDER — TOPIRAMATE 25 MG PO TABS: ORAL_TABLET | ORAL | Status: AC

## 2015-08-01 NOTE — Patient Instructions (Signed)
1. Continue Topiramate 25mg : take 2 tablets daily 2. Follow-up with your gynecologist to discuss birth control options 3. Follow-up in 6 months, call for any changes

## 2015-08-01 NOTE — Progress Notes (Signed)
NEUROLOGY FOLLOW UP OFFICE NOTE  Jordan Dominguez [id number]  HISTORY OF PRESENT ILLNESS: I had the pleasure of seeing Jordan Dominguez in follow-up in the neurology clinic on 08/01/2015.  The patient was last seen 4 months ago for worsening migraines occurring on a near-daily basis. She had a good response to Topamax, currently on  qhs reporting that her migraines are getting better. 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 She had a migraine with vomiting last February. She denies any significant side effects on Topamax except for occasional tingling. Sometimes she would forget a word, but is unsure if this is medication-related or "just me." She reports a catamenial component to her migraines, she has not yet seen her OB to discuss tricycling.   HPI 12/24/14: This is a pleasant 25 yo RH woman with a history of migraines since high school, who presented with change in headaches and increased frequency of headaches. She has migraines around 3 times a month, with diffuse throbbing 10/10 pain with associated nausea and significant photophobia, and dizziness. Over the past year or so, she has been having a different kind of headache that has been occurring every other day. These headaches are over the frontal region, with 6 to 7 over 10 pressure and throbbing pain, lasting 2-3 hours with mild photo and phonophobia, no nausea or vomiting. She has also noticed a change in her migraines, in the past there was no catamenial component, but recently she would have migraines daily the week of her period. She has been taking 2 tablets of Excedrin 3 times a week on average. She has noticed that stress is a trigger for the migraines, she had less this summer vacation. She usually gets 8-9 hours of sleep but always feels tired. She denies any snoring. She denies any diplopia, dysarthria, dysphagia, focal numbness/tingling/weakness, neck/back  pain, bowel/bladder dysfunction. She was given a prescription for Relpax, which did help with her migraine, but is too cost-prohibitive. There is a strong family history of migraines in her mother, father, and maternal grandmother. She denies any significant head injuries. No change in medications, she has been on the same birth control since age 25.  Diagnostic Data: MRI brain without contrast 12/25/14 normal.  PAST MEDICAL HISTORY: Past Medical History  Diagnosis Date  . Migraines     MEDICATIONS: Current Outpatient Prescriptions on File Prior to Visit  Medication Sig Dispense Refill  . benzonatate (TESSALON) 100 MG capsule Take 1 capsule (100 mg total) by mouth 3 (three) times daily as needed for cough. 30 capsule 0  . cephALEXin (KEFLEX) 500 MG capsule Take 1 capsule (500 mg total) by mouth 2 (two) times daily. 20 capsule 0  . norethindrone-ethinyl estradiol (JUNEL FE 1/20) 1-20 MG-MCG tablet Take 1 tablet by mouth daily. 28 tablet 11  . SUMAtriptan (IMITREX) 25 MG tablet Take 1 tablet at onset of migraine. May take second dose after 2 hours. Do not take more than 3 a week. 10 tablet 5  . topiramate (TOPAMAX) 25 MG tablet Take 2 tablets at bedtime 60 tablet 5  . fluticasone (FLONASE) 50 MCG/ACT nasal spray Place 2 sprays into both nostrils daily. 16 g 1   No current facility-administered medications on file prior to visit.    ALLERGIES: No Known Allergies  FAMILY HISTORY: Family History  Problem Relation Age of Onset  . Diabetes Maternal Grandmother   . Hyperlipidemia Maternal Grandmother   .  Hypertension Maternal Grandmother   . ADD / ADHD Mother     SOCIAL HISTORY: Social History   Social History  . Marital Status: Single    Spouse Name: N/A  . Number of Children: N/A  . Years of Education: N/A   Occupational History  . Not on file.   Social History Main Topics  . Smoking status: Never Smoker   . Smokeless tobacco: Never Used  . Alcohol Use: 0.0 oz/week    0  Standard drinks or equivalent per week     Comment: once a week  . Drug Use: No  . Sexual Activity:    Partners: Male    Birth Control/ Protection: Pill   Other Topics Concern  . Not on file   Social History Narrative    REVIEW OF SYSTEMS: Constitutional: No fevers, chills, or sweats, no generalized fatigue, change in appetite Eyes: No visual changes, double vision, eye pain Ear, nose and throat: No hearing loss, ear pain, nasal congestion, sore throat Cardiovascular: No chest pain, palpitations Respiratory:  No shortness of breath at rest or with exertion, wheezes GastrointestinaI: No nausea, vomiting, diarrhea, abdominal pain, fecal incontinence Genitourinary:  No dysuria, urinary retention or frequency Musculoskeletal:  No neck pain, back pain Integumentary: No rash, pruritus, skin lesions Neurological: as above Psychiatric: No depression, insomnia, anxiety Endocrine: No palpitations, fatigue, diaphoresis, mood swings, change in appetite, change in weight, increased thirst Hematologic/Lymphatic:  No anemia, purpura, petechiae. Allergic/Immunologic: no itchy/runny eyes, nasal congestion, recent allergic reactions, rashes  PHYSICAL EXAM: Filed Vitals:   08/01/15 1608  BP: 130/76  Pulse: 96   General: No acute distress Head:  Normocephalic/atraumatic Neck: supple, no paraspinal tenderness, full range of motion Heart:  Regular rate and rhythm Lungs:  Clear to auscultation bilaterally Back: No paraspinal tenderness Skin/Extremities: No rash, no edema Neurological Exam: alert and oriented to person, place, and time. No aphasia or dysarthria. Fund of knowledge is appropriate.  Recent and remote memory are intact.2/3 delayed recall.  Attention and concentration are normal.    Able to name objects and repeat phrases. Cranial nerves: Pupils equal, round, reactive to light.  Fundoscopic exam unremarkable, no papilledema. Extraocular movements intact with no nystagmus. Visual fields  full. Facial sensation intact. No facial asymmetry. Tongue, uvula, palate midline.  Motor: Bulk and tone normal, muscle strength 5/5 throughout with no pronator drift.  Sensation to light touch intact.  No extinction to double simultaneous stimulation.  Deep tendon reflexes 2+ throughout, toes downgoing.  Finger to nose testing intact.  Gait narrow-based and steady, able to tandem walk adequately.  Romberg negative.  IMPRESSION: This is a pleasant 25 yo RH woman with a history of migraines since high school, who presented with worsening migraines and headaches. MRI brain normal. She is doing well on Topamax  qhs, with migraines 0-1 times a month, mostly around the time of her menstrual period or stress. She takes prn Excedrin with good effect, and knows minimize rescue medication to 2-3 a week to avoid rebound headaches. She has no pregnancy plans at this time. She will discuss birth control options with her gynecologist. She will continue her headache diary and follow-up in 6 months.   Thank you for allowing me to participate in her care.  Please do not hesitate to call for any questions or concerns.  The duration of this appointment visit was 15 minutes of face-to-face time with the patient.  Greater than 50% of this time was spent in counseling, explanation of diagnosis, planning  of further management, and coordination of care.   Patrcia DollyKaren Racquel Arkin, M.D.   CC: Dr. Laury AxonLowne

## 2015-10-24 ENCOUNTER — Telehealth: Payer: Self-pay | Admitting: Family Medicine

## 2015-10-24 MED ORDER — NORETHIN ACE-ETH ESTRAD-FE 1-20 MG-MCG PO TABS: 1.0000 | ORAL_TABLET | Freq: Every day | ORAL | Status: AC

## 2015-10-24 NOTE — Telephone Encounter (Signed)
Her CPE is due in July. Rx sent for 3 mos. Pease schedule her CPE.      KP

## 2015-10-24 NOTE — Telephone Encounter (Signed)
Pt called in to see if PCP could call her in a refill on her birth control pills. Pt was informed that an appt is needed. Not showing that pt is due her CPE as of yet. Informed pt that a F/U could be needed. She would like to be sure.    Please advise further.   Thanks.

## 2015-10-24 NOTE — Telephone Encounter (Signed)
Error

## 2015-10-25 NOTE — Telephone Encounter (Signed)
Patient states that the 3 month supply will hold her, she will be moving to [place] next month and will get a Provider when she gets there.

## 2016-02-01 ENCOUNTER — Ambulatory Visit: Payer: Commercial Managed Care - PPO | Admitting: Neurology

## 2016-02-18 IMAGING — MR MR HEAD W/O CM
7 of 8 series · 39 of 48 positions shown · non-contrast
Comparison: None.

CLINICAL DATA: Headaches for 1 year, worsening. Dizziness at times.

EXAM:
MRI HEAD WITHOUT CONTRAST
TECHNIQUE: Multiplanar, multiecho pulse sequences of the brain and surrounding
structures were obtained without intravenous contrast.

[Series 2: T1 · sagittal · 5.0mm · 0.45mm/px · 3 of 23 slices shown]
[im 1/23]
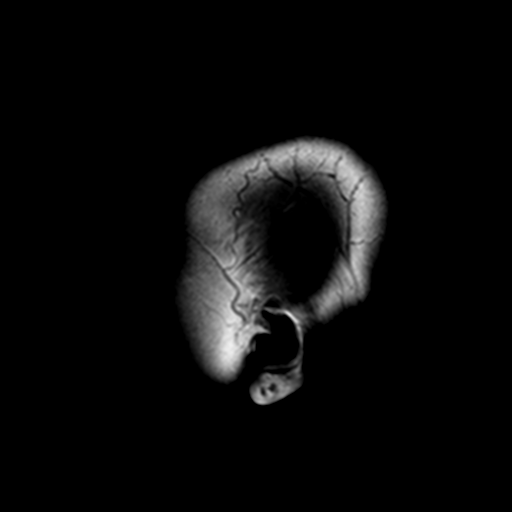
[im 12/23]
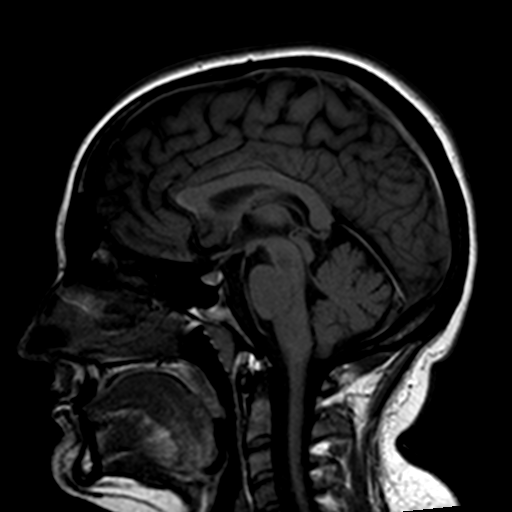
[im 23/23]
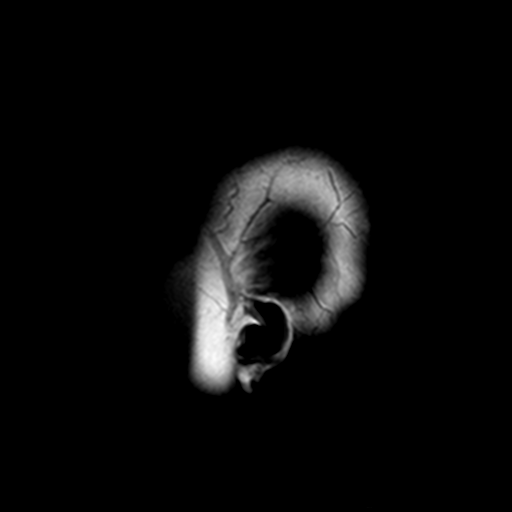

[Series 3: DWI · axial · 3.0mm · 2.19mm/px · z∈[-51,+93]mm · 13 of 89 slices shown (1 of 2)]
[im 1/89]
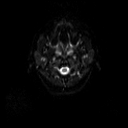
[im 8/89]
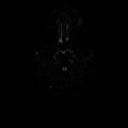
[im 15/89]
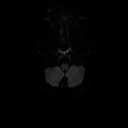
[im 23/89]
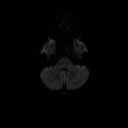
[im 30/89]
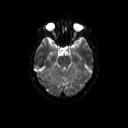
[im 37/89]
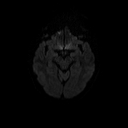
[im 45/89]
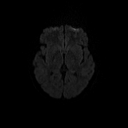
[im 52/89]
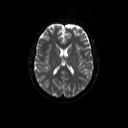
[im 59/89]
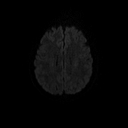
[im 67/89]
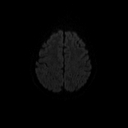
[im 74/89]
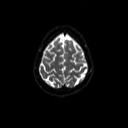
[im 81/89]
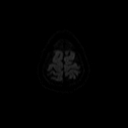
[im 89/89]
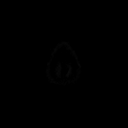

[Series 4: DWI · axial · 3.0mm · 2.19mm/px · z∈[-51,+93]mm · 7 of 45 slices shown (2 of 2)]
[im 1/45]
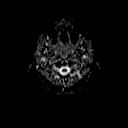
[im 8/45]
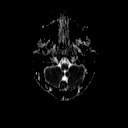
[im 15/45]
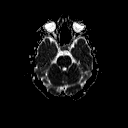
[im 23/45]
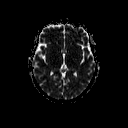
[im 30/45]
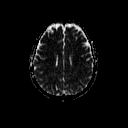
[im 37/45]
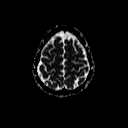
[im 45/45]
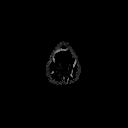

[Series 5: T2 · axial · 5.0mm · 0.45mm/px · z∈[-57,+97]mm · 4 of 23 slices shown (1 of 2)]
[im 1/23]
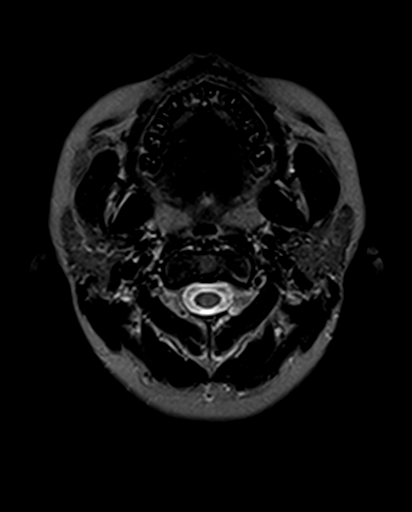
[im 8/23]
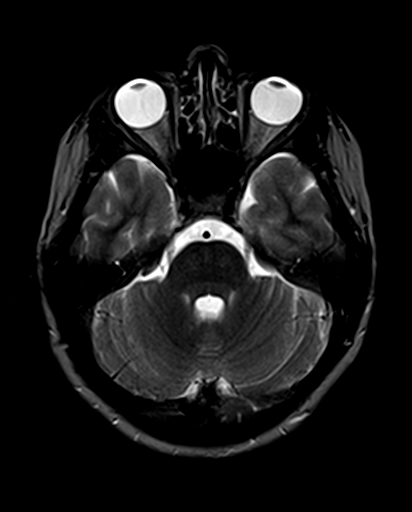
[im 15/23]
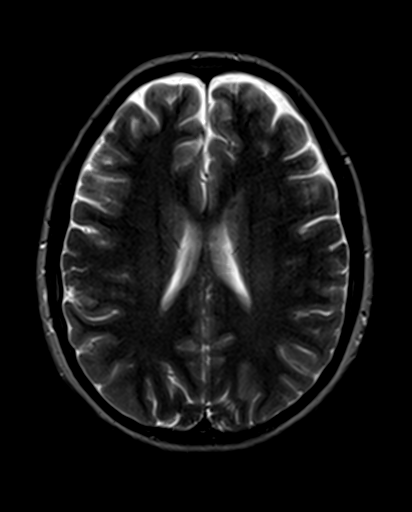
[im 23/23]
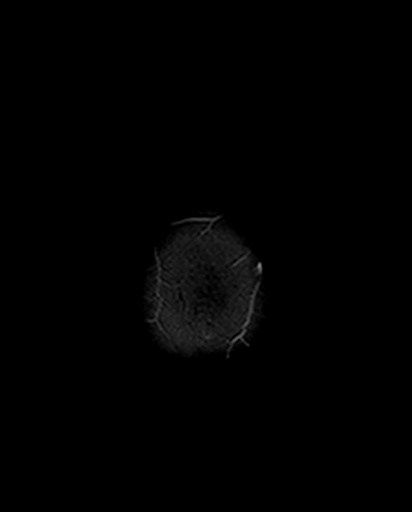

[Series 6: T2 · axial · 5.0mm · 0.45mm/px · z∈[-57,+96]mm · 4 of 23 slices shown (2 of 2)]
[im 1/23]
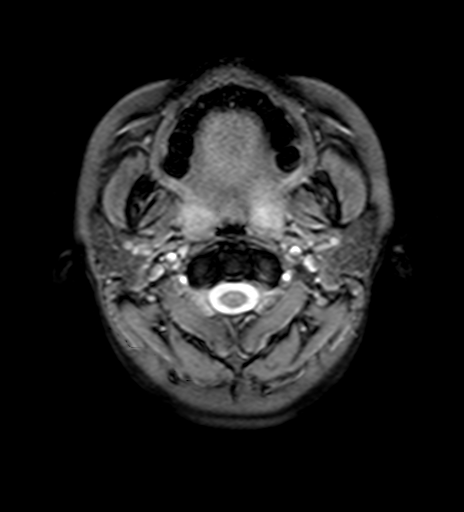
[im 8/23]
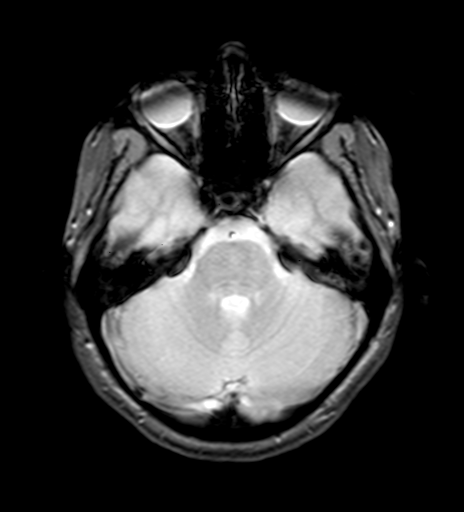
[im 15/23]
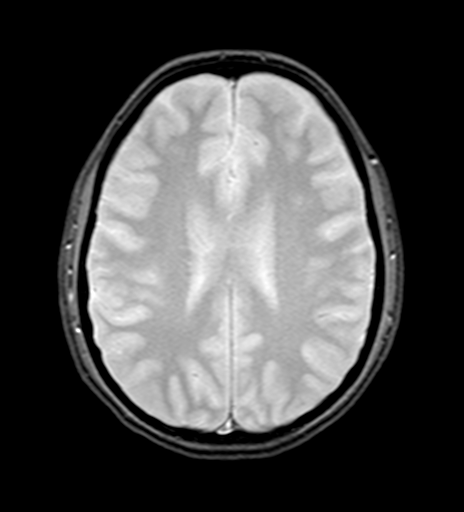
[im 23/23]
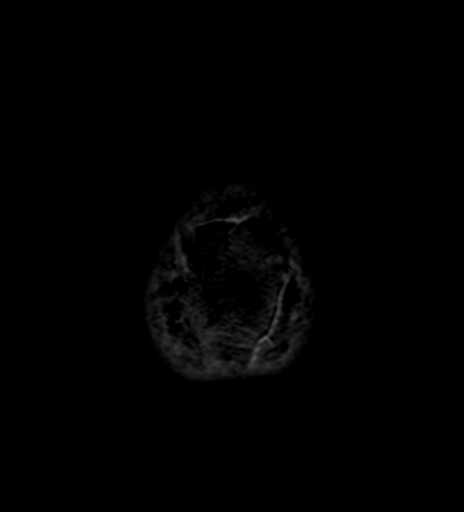

[Series 7: FLAIR · axial · 5.0mm · 0.45mm/px · z∈[-57,+96]mm · 4 of 23 slices shown]
[im 1/23]
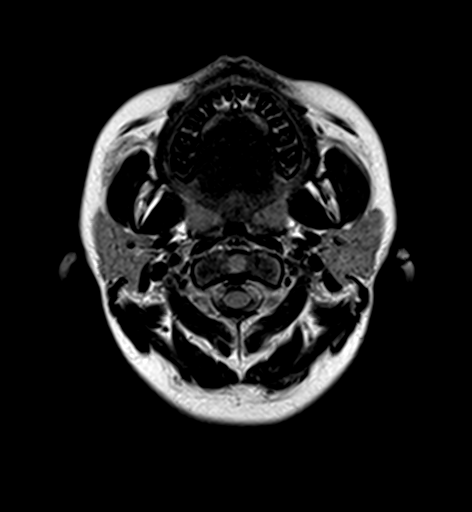
[im 8/23]
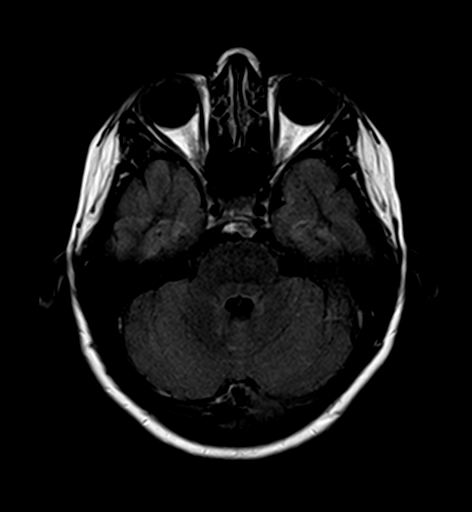
[im 15/23]
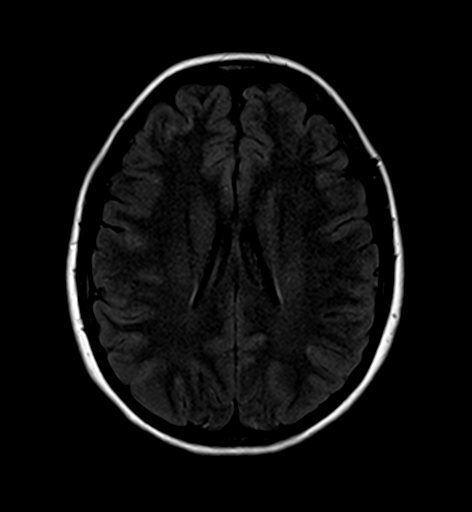
[im 23/23]
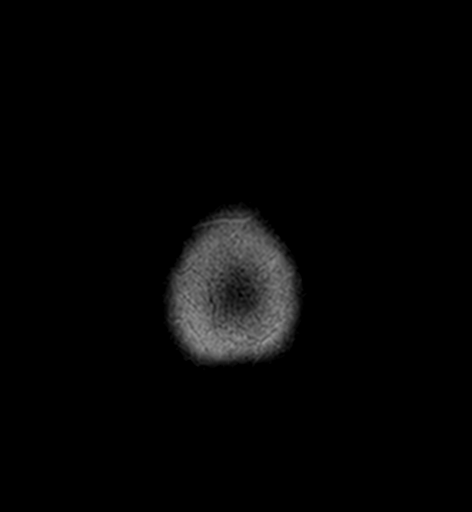

[Series 9: T2 post-contrast · coronal · 5.0mm · 0.45mm/px · 4 of 28 slices shown]
[im 1/28]
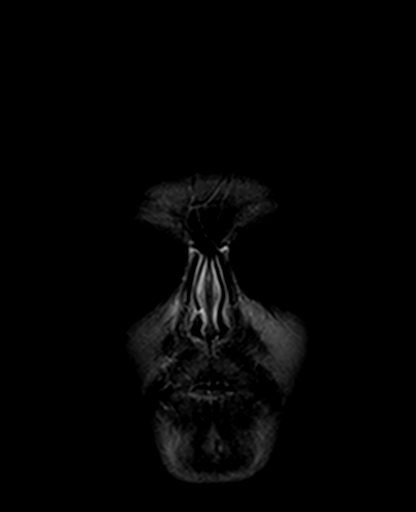
[im 10/28]
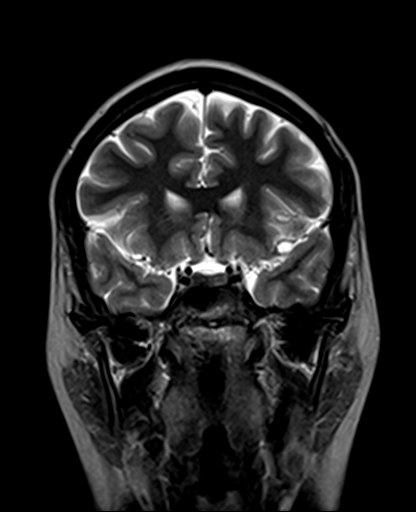
[im 19/28]
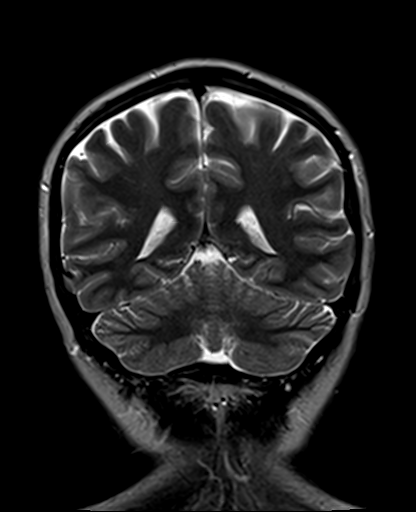
[im 28/28]
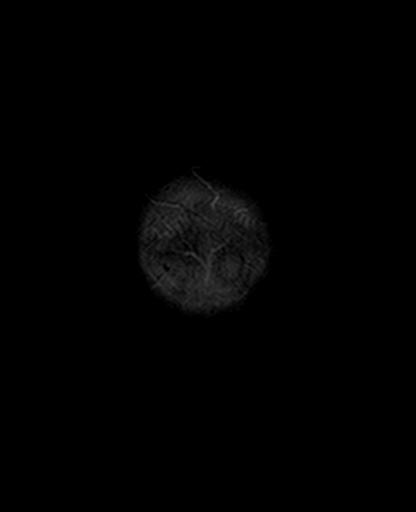

[39 of 48 positions shown; findings below may reference images not displayed]

FINDINGS: There is no acute infarct. Ventricles and sulci are normal for age.
There is no evidence of intracranial hemorrhage, mass, midline
shift, or extra-axial fluid collection. No brain parenchymal signal
abnormality is identified.

Orbits are unremarkable. Paranasal sinuses and mastoid air cells are
clear. Major intracranial vascular flow voids are preserved.
Calvarium and scalp soft tissues are unremarkable.
IMPRESSION: Unremarkable brain MRI.
